# Patient Record
Sex: Male | Born: 1950 | Race: White | Hispanic: No | Marital: Married | Smoking: Former smoker
Health system: Southern US, Community
[De-identification: ages and names within clinical notes are randomized; demographics above are authoritative.]

## PROBLEM LIST (undated history)

## (undated) DIAGNOSIS — E559 Vitamin D deficiency, unspecified: Secondary | ICD-10-CM

## (undated) DIAGNOSIS — E291 Testicular hypofunction: Secondary | ICD-10-CM

## (undated) DIAGNOSIS — Z9852 Vasectomy status: Secondary | ICD-10-CM

## (undated) DIAGNOSIS — I10 Essential (primary) hypertension: Secondary | ICD-10-CM

## (undated) DIAGNOSIS — Z9889 Other specified postprocedural states: Secondary | ICD-10-CM

## (undated) DIAGNOSIS — E119 Type 2 diabetes mellitus without complications: Secondary | ICD-10-CM

## (undated) DIAGNOSIS — E785 Hyperlipidemia, unspecified: Secondary | ICD-10-CM

## (undated) HISTORY — DX: Hyperlipidemia, unspecified: E78.5

## (undated) HISTORY — DX: Vitamin D deficiency, unspecified: E55.9

## (undated) HISTORY — DX: Testicular hypofunction: E29.1

## (undated) HISTORY — DX: Vasectomy status: Z98.52

## (undated) HISTORY — DX: Type 2 diabetes mellitus without complications: E11.9

## (undated) HISTORY — DX: Other specified postprocedural states: Z98.890

## (undated) HISTORY — DX: Essential (primary) hypertension: I10

---

## 1985-04-14 DIAGNOSIS — Z9852 Vasectomy status: Secondary | ICD-10-CM

## 1985-04-14 HISTORY — PX: VASECTOMY: SHX75

## 1985-04-14 HISTORY — DX: Vasectomy status: Z98.52

## 1995-04-15 HISTORY — PX: INGUINAL HERNIA REPAIR: SUR1180

## 1996-04-14 HISTORY — PX: INGUINAL HERNIA REPAIR: SUR1180

## 1998-01-11 ENCOUNTER — Ambulatory Visit (HOSPITAL_COMMUNITY): Admission: RE | Admit: 1998-01-11 | Discharge: 1998-01-11 | Payer: Self-pay | Admitting: Internal Medicine

## 2000-01-10 ENCOUNTER — Ambulatory Visit (HOSPITAL_COMMUNITY): Admission: RE | Admit: 2000-01-10 | Discharge: 2000-01-10 | Payer: Self-pay | Admitting: Internal Medicine

## 2000-01-10 ENCOUNTER — Encounter: Payer: Self-pay | Admitting: Internal Medicine

## 2002-08-24 ENCOUNTER — Encounter: Payer: Self-pay | Admitting: Surgery

## 2002-08-24 ENCOUNTER — Ambulatory Visit (HOSPITAL_COMMUNITY): Admission: RE | Admit: 2002-08-24 | Discharge: 2002-08-24 | Payer: Self-pay | Admitting: Surgery

## 2003-04-15 DIAGNOSIS — E785 Hyperlipidemia, unspecified: Secondary | ICD-10-CM

## 2003-04-15 HISTORY — DX: Hyperlipidemia, unspecified: E78.5

## 2004-04-14 HISTORY — PX: COLONOSCOPY: SHX174

## 2004-07-16 ENCOUNTER — Ambulatory Visit: Payer: Self-pay | Admitting: Gastroenterology

## 2004-09-16 ENCOUNTER — Ambulatory Visit: Payer: Self-pay | Admitting: Gastroenterology

## 2004-09-23 ENCOUNTER — Ambulatory Visit: Payer: Self-pay | Admitting: Gastroenterology

## 2006-04-14 DIAGNOSIS — E559 Vitamin D deficiency, unspecified: Secondary | ICD-10-CM

## 2006-04-14 HISTORY — DX: Vitamin D deficiency, unspecified: E55.9

## 2006-09-23 ENCOUNTER — Emergency Department (HOSPITAL_COMMUNITY): Admission: EM | Admit: 2006-09-23 | Discharge: 2006-09-23 | Payer: Self-pay | Admitting: Emergency Medicine

## 2007-04-15 DIAGNOSIS — I10 Essential (primary) hypertension: Secondary | ICD-10-CM

## 2007-04-15 HISTORY — DX: Essential (primary) hypertension: I10

## 2007-04-15 HISTORY — PX: INGUINAL HERNIA REPAIR: SUR1180

## 2008-04-14 HISTORY — PX: KNEE ARTHROSCOPY: SHX127

## 2008-08-21 ENCOUNTER — Ambulatory Visit (HOSPITAL_BASED_OUTPATIENT_CLINIC_OR_DEPARTMENT_OTHER): Admission: RE | Admit: 2008-08-21 | Discharge: 2008-08-21 | Payer: Self-pay | Admitting: Specialist

## 2008-12-27 IMAGING — CT CT ABDOMEN W/O CM
2 of 4 series · 14 of 32 positions shown, 19 images · IV contrast (agent unspecified)
Comparison: None available.

CLINICAL DATA: Left flank pain.  Question ureteral calculus. 
ABDOMEN CT WITHOUT CONTRAST:
TECHNIQUE: Multidetector CT imaging of the abdomen was performed following the standard protocol without IV contrast.
TECHNIQUE: Multidetector CT imaging of the pelvis was performed following the standard protocol without IV contrast.

[Series 2: (person_name) · axial · 0.82mm/px · z∈[-424,-74]mm · 6 of 98 slices shown, 11 images]
[im 14/98  soft-tissue]
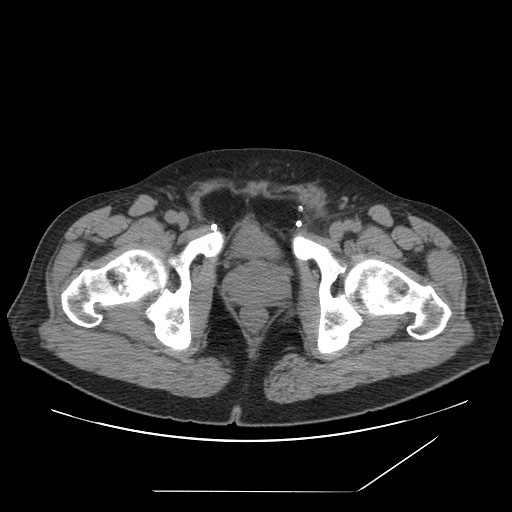
[im 14/98  bone]
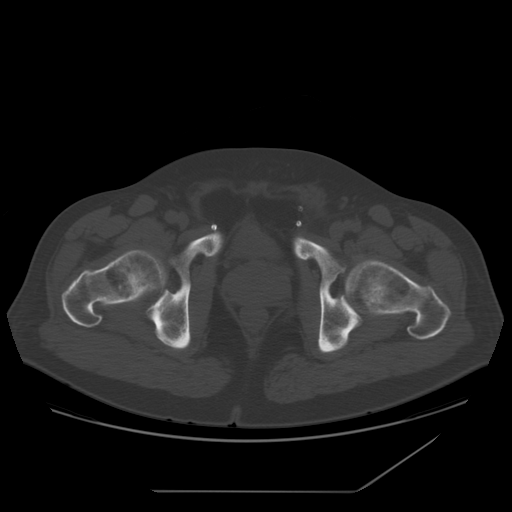
[im 28/98  soft-tissue]
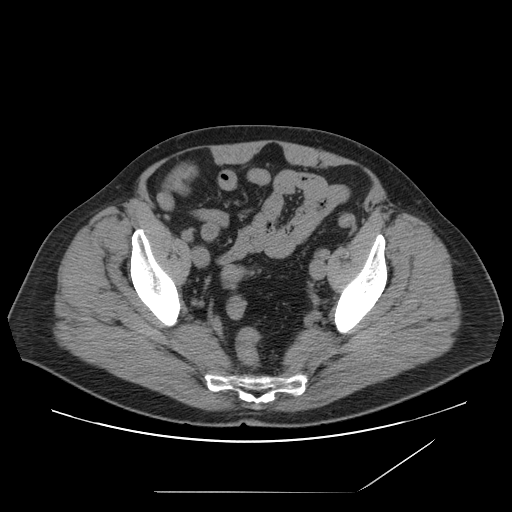
[im 42/98  soft-tissue]
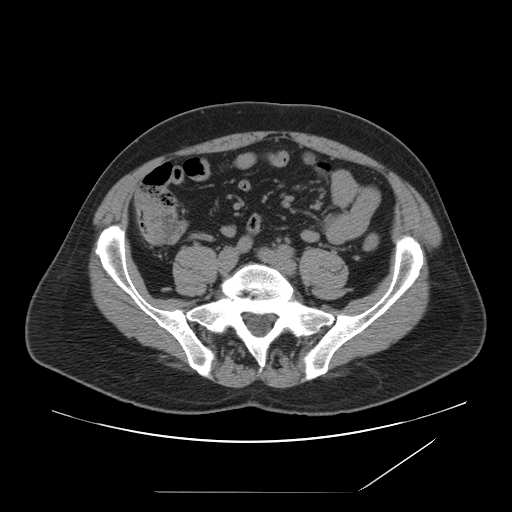
[im 42/98  lung]
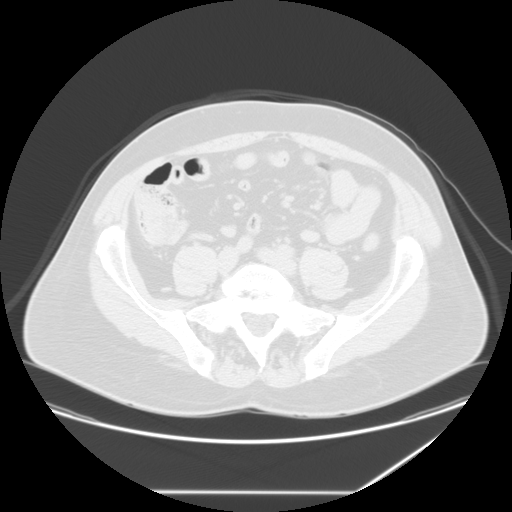
[im 56/98  soft-tissue]
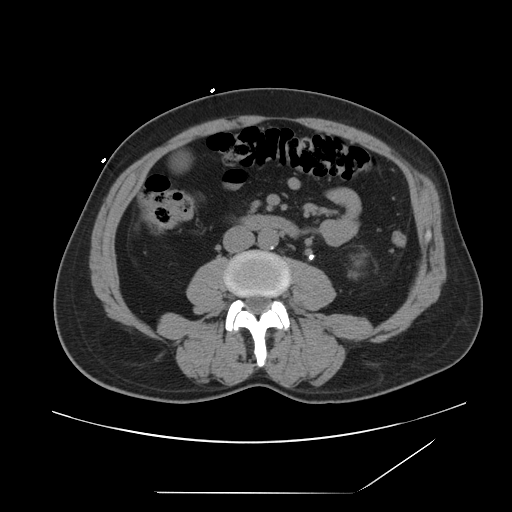
[im 56/98  lung]
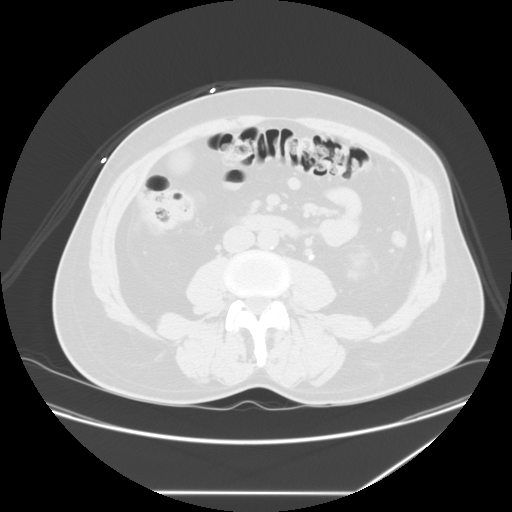
[im 70/98  soft-tissue]
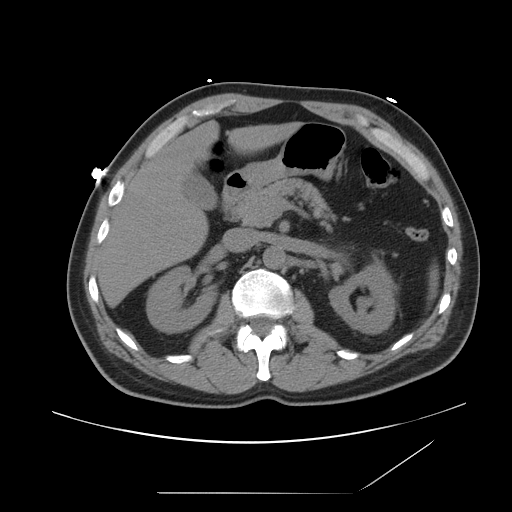
[im 70/98  lung]
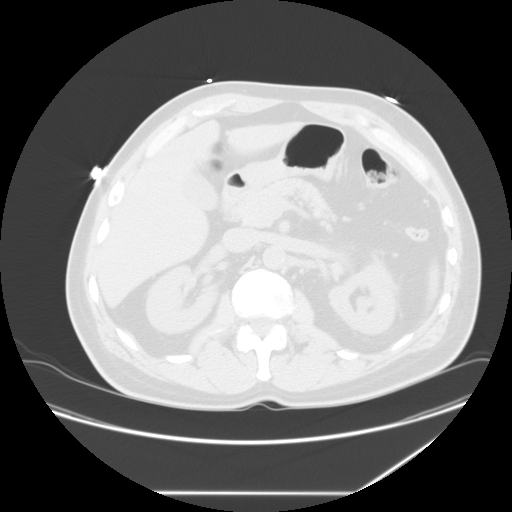
[im 84/98  soft-tissue]
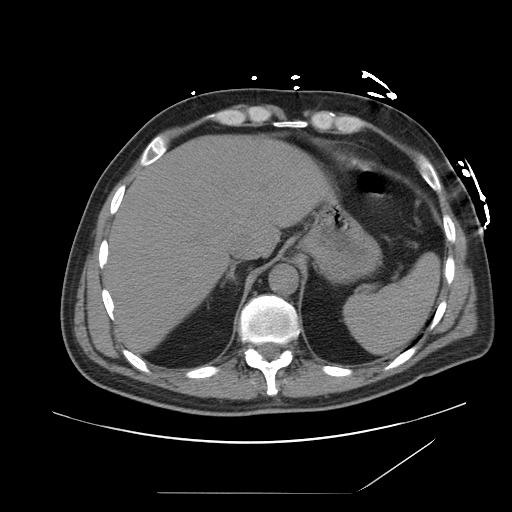
[im 84/98  lung]
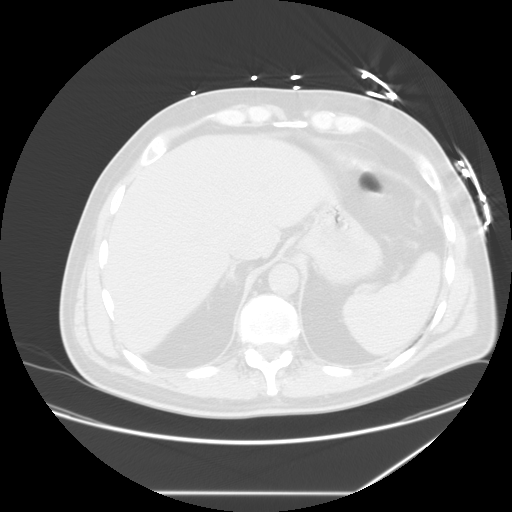

[Series 103: reformatted · sagittal · 0.82mm/px · 8 of 158 slices shown]
[im 15/158  soft-tissue]
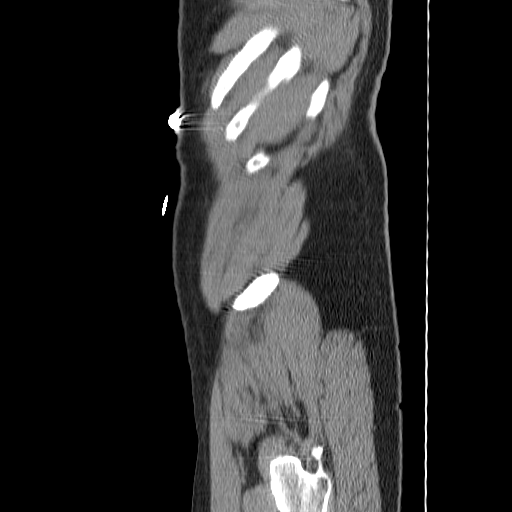
[im 29/158  soft-tissue]
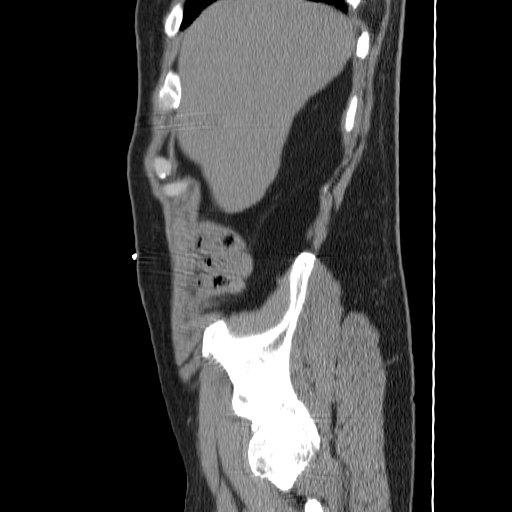
[im 58/158  soft-tissue]
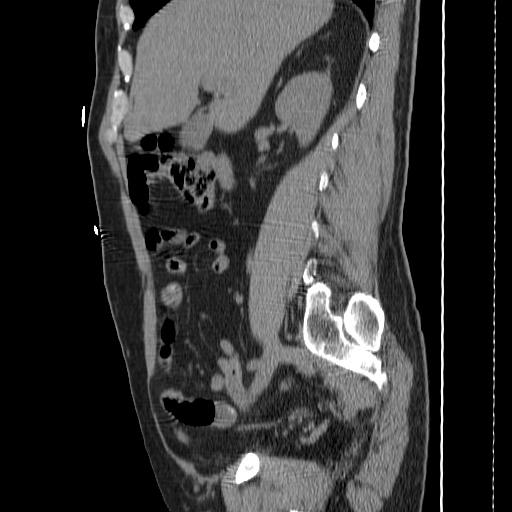
[im 72/158  soft-tissue]
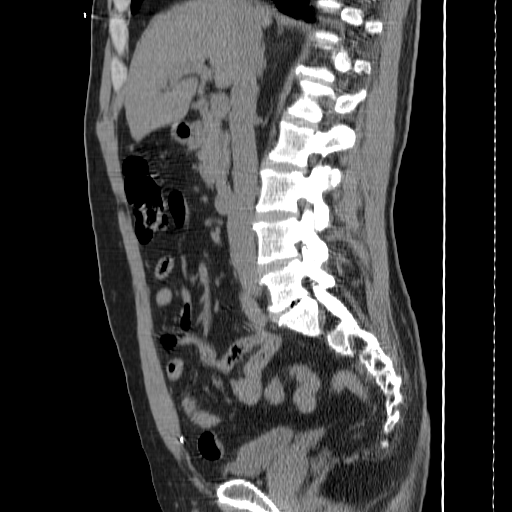
[im 86/158  soft-tissue]
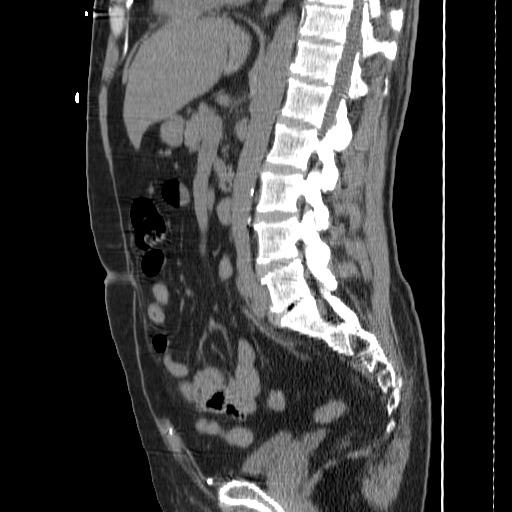
[im 100/158  soft-tissue]
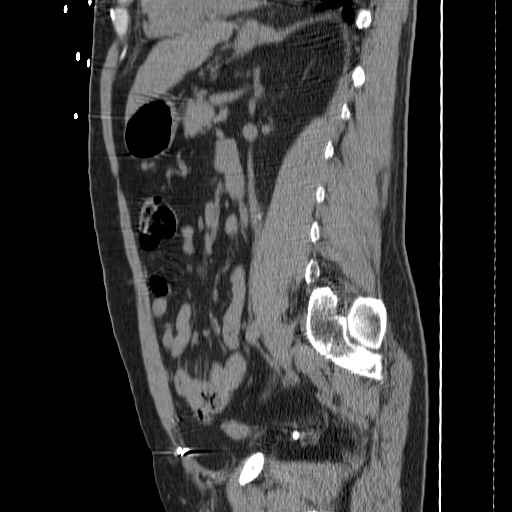
[im 129/158  soft-tissue]
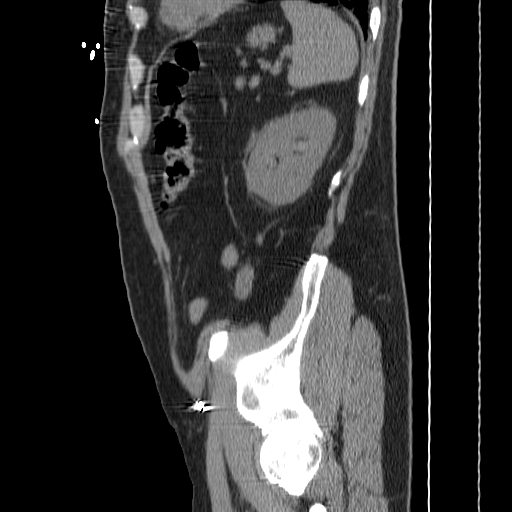
[im 143/158  soft-tissue]
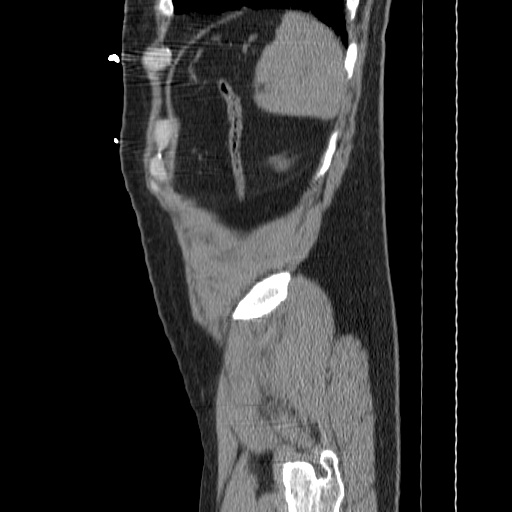

[14 of 32 positions shown; findings below may reference images not displayed]

FINDINGS: There is bibasilar atelectasis.  The left kidney demonstrates mild hydronephrosis and moderate perinephric soft tissue stranding.  The ureter is dilated to the L3-4 disc space level where there is a 5 mm calculus seen on image 43.  This is not clearly seen on the scout view due to overlying gas in the transverse colon.  No renal calculi are demonstrated.  The right kidney appears unremarkable as imaged in the unenhanced state.  The liver, spleen, gallbladder, pancreas and adrenal glands also appear unremarkable as imaged in the unenhanced state.
IMPRESSION: Obstructing 5 mm calculus in the proximal left ureter as described. 
PELVIS CT WITHOUT CONTRAST:
FINDINGS: Distally, the ureters are normal in caliber.  There are no bladder calculi.  There are postsurgical changes of the suprapubic anterior abdominal wall without evidence of recurrent hernia.  The prostate gland is mildly enlarged.
IMPRESSION: No acute pelvic findings.

## 2009-10-14 ENCOUNTER — Emergency Department (HOSPITAL_COMMUNITY)
Admission: EM | Admit: 2009-10-14 | Discharge: 2009-10-14 | Payer: Self-pay | Source: Home / Self Care | Admitting: Emergency Medicine

## 2010-06-30 LAB — URINALYSIS, ROUTINE W REFLEX MICROSCOPIC
Bilirubin Urine: NEGATIVE
Glucose, UA: NEGATIVE mg/dL
Hgb urine dipstick: NEGATIVE
Leukocytes, UA: NEGATIVE
Nitrite: NEGATIVE
Protein, ur: 30 mg/dL — AB
Specific Gravity, Urine: 1.043 — ABNORMAL HIGH (ref 1.005–1.030)
Urobilinogen, UA: 1 mg/dL (ref 0.0–1.0)
pH: 6 (ref 5.0–8.0)

## 2010-06-30 LAB — URINE MICROSCOPIC-ADD ON

## 2010-07-14 DIAGNOSIS — E119 Type 2 diabetes mellitus without complications: Secondary | ICD-10-CM

## 2010-07-14 HISTORY — DX: Type 2 diabetes mellitus without complications: E11.9

## 2010-07-23 LAB — BASIC METABOLIC PANEL
BUN: 13 mg/dL (ref 6–23)
Calcium: 9.5 mg/dL (ref 8.4–10.5)
Creatinine, Ser: 0.8 mg/dL (ref 0.4–1.5)
GFR calc non Af Amer: >60 mL/min (ref >60)
Glucose, Bld: 112 mg/dL — ABNORMAL HIGH (ref 70–99)

## 2010-07-23 LAB — CBC
HCT: 44.2 % (ref 39.0–52.0)
Platelets: 264 10*3/uL (ref 150–400)
RDW: 12.4 % (ref 11.5–15.5)
WBC: 6.6 10*3/uL (ref 4.0–10.5)

## 2010-08-27 NOTE — Op Note (Signed)
NAME:  Parker Humphrey, GLOSS NO.:  [id number]   MEDICAL RECORD NO.:  [id number]          PATIENT TYPE:  AMB   LOCATION:  [facility]                         FACILITY:  [facility]   PHYSICIAN:  Jene Every, M.D.    DATE OF BIRTH:  [birth year]   DATE OF PROCEDURE:  08/21/2008  DATE OF DISCHARGE:                               OPERATIVE REPORT   PREOPERATIVE DIAGNOSIS:  Medial meniscus tear, left knee.   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   PROCEDURES PERFORMED:  1. Left knee arthroscopy.  2. Chondroplasty of the medial femoral condyle, medial tibial plateau,      lateral tibial plateau, patella and femoral sulcus.  3. Partial medial meniscectomy.  4. Lavage.   BRIEF HISTORY:  A 60 year old with end-stage osteoarthrosis of the knee,  degenerative changes of the medial compartment, meniscus tear on MRI,  locking and giving way.  Refractory to conservative treatment.  He was  indicated for arthroscopic debridement, partial medial meniscectomy.  The risks and benefits were discussed including bleeding, infection,  damage to vessels, DVT, PE, anesthetic complications, need for total  knee arthroplasty, etc.   TECHNIQUE:  With the patient in supine position after adequate general  endotracheal anesthesia, 1 gram of Kefzol, the left lower extremity was  prepped and draped in the usual sterile fashion.  The lateral  parapatellar portal and superomedial parapatellar portal was fashioned  with a #11 blade.  Ingress cannula atraumatically placed.  Irrigant was  utilized to insufflate the joint and direct visualization the medial  parapatellar portal was fashioned with a #11 blade after localization  with 18 gauge needle sparing the medial meniscus.  Noted there was  extensive grade III changes of femoral condyle  and grade IV changes  well.  The shaver was introduced and utilized to perform a chondroplasty  lightly of the medial femoral condyle and tibial plateau.  There was a  tear of medial meniscus which was unstable that was resected to a stable  base with a 4.2 Kuda shaver.  The loose bodies were evacuated as well.  The lateral compartment revealed some grade III changes, less so than  the medial compartment.  A chondroplasty was performed.  Here there was  radial tearing of lateral meniscus.  This was debrided to a stable base.   The ACL and PCL were unremarkable.   Since the grade III changes of the patella a chondroplasty was performed  here.  There was normal patellofemoral tracking.  The gutters were  unremarkable.   The knee was copiously lavaged and I reexamined the medial compartment.  No further pathology amenable to arthroscopic intervention.   Next, the knee was copiously lavaged.  All instrumentation was removed.  The portals were closed with 4-0 nylon simple sutures.  Marcaine 0.25%  with epinephrine was infiltrated in the joint.  The wound was dressed  sterilely.  He was awoken without difficulty and transported to the  recovery room in  satisfactory condition.   The patient tolerated the procedure and there were no complications.   No assistant.      Jene Every, M.D.  Electronically Signed     JB/MEDQ  D:  08/21/2008  T:  08/21/2008  Job:  [id number]

## 2010-08-30 NOTE — Op Note (Signed)
NAME:  Parker Humphrey, Parker Humphrey                          ACCOUNT NO.:  [id number]   MEDICAL RECORD NO.:  [id number]                   PATIENT TYPE:  AMB   LOCATION:  DAY                                  FACILITY:  [facility]   PHYSICIAN:  Thornton Park. Daphine Deutscher, M.D.             DATE OF BIRTH:  Jan 19, 1951   DATE OF PROCEDURE:  08/24/2002  DATE OF DISCHARGE:                                 OPERATIVE REPORT   PREOPERATIVE DIAGNOSIS:  Bilateral inguinal hernias with a twice recurrent  hernia on the left.   POSTOPERATIVE DIAGNOSIS:  Left recurrent direct inguinal hernia with  incarcerated properitoneal fat and right direct inguinal hernia.   SURGEON:  Thornton Park. Daphine Deutscher, M.D.   PROCEDURE:  Laparoscopic preperitoneal bilateral inguinal hernia repair with  bard 3D max mesh right large, left medium.   DESCRIPTION OF PROCEDURE:  Parker Humphrey was taken to room one and general  anesthesia was administered. Preoperatively he did receive 1 g of Ancef. The  abdomen was shaved and then prepped widely with Betadine including his  genitals to his xiphoid. A transverse incision was made beneath his  umbilicus and I slid off to the right side and incised the rectus  longitudinally. I swept the muscle laterally and then with my finger went  down along the medial aspect of the rectus abdominis sheath. I inserted the  balloon which went down to the pubis. The balloon then deployed although it  did take down the right rectus muscle somewhat through the anterior  abdominal wall. I inserted the balloon tip catheter into this space and  inflated the preperitoneal space. I did get some concomitant Pneumo and  inserted a Veress needle subsequently to decompress that. In the meantime, I  under direct vision inserted a needle and placed two 5 mm ports slightly to  either side of the midline and these were placed under direct vision with a  scope. Ultimately, a 30 degree scope was used. The patient had an  incarcerated left  inguinal hernia that had properitoneal mesh. This was  pulled out of the hole and then easily visible direct hernia defect was  left. I dissected the cord structures and it seemed that there was no  evidence of an indirect hernia that had recurred. On the right side, he had  a broad base direct defect. Both were checked using a finger down in the  groin to palpate. A large piece of mesh on the right was then inserted which  covered completely the right defect as well as it overlapped in the midline  and covered the very medial direct recurrence on the left. I then put a  piece of three medium left 3D max off to the left side which gave good  overlap and complete coverage to the left side as well. Both were tacked  along Cooper's ligament and anteriorly and laterally only to where I could  feel  them. Essentially no bleeding was noted. The  preperitoneal space was deflated under direct vision. The umbilical fascial  defect was closed with 2-0 Vicryl. 4-0 Vicryl was used in the subcutaneous  tissue and Benzoin and Steri-Strips used on the skin. The patient seemed to  tolerate the procedure well and was taken to the recovery room in  satisfactory condition.                                               Thornton Park Daphine Deutscher, M.D.    MBM/MEDQ  D:  08/24/2002  T:  08/25/2002  Job:  [id number]   cc:   Lucky Cowboy, M.D.  [address]  [place], [place] [address]  Fax: [phone]

## 2011-01-30 LAB — I-STAT 8, (EC8 V) (CONVERTED LAB)
Chloride: 106
HCT: 44
Hemoglobin: 15
Operator id: 189501
Potassium: 3.7
Sodium: 140
TCO2: 27
pH, Ven: 7.366 — ABNORMAL HIGH

## 2011-01-30 LAB — URINALYSIS, ROUTINE W REFLEX MICROSCOPIC
Bilirubin Urine: NEGATIVE
Hgb urine dipstick: NEGATIVE
Ketones, ur: >80 — AB
Specific Gravity, Urine: 1.023
Urobilinogen, UA: 1

## 2011-01-30 LAB — POCT I-STAT CREATININE: Operator id: 189501

## 2013-01-12 DIAGNOSIS — E291 Testicular hypofunction: Secondary | ICD-10-CM

## 2013-01-12 HISTORY — DX: Testicular hypofunction: E29.1

## 2013-03-13 ENCOUNTER — Other Ambulatory Visit: Payer: Self-pay | Admitting: Internal Medicine

## 2013-03-31 ENCOUNTER — Other Ambulatory Visit: Payer: Self-pay | Admitting: Internal Medicine

## 2013-04-26 ENCOUNTER — Ambulatory Visit: Payer: Self-pay | Admitting: Internal Medicine

## 2013-09-14 ENCOUNTER — Ambulatory Visit (INDEPENDENT_AMBULATORY_CARE_PROVIDER_SITE_OTHER): Payer: BC Managed Care – PPO | Admitting: Internal Medicine

## 2013-09-14 ENCOUNTER — Encounter: Payer: Self-pay | Admitting: Internal Medicine

## 2013-09-14 VITALS — BP 128/80 | HR 64 | Temp 97.9°F | Resp 16 | Ht 72.5 in | Wt 192.0 lb

## 2013-09-14 DIAGNOSIS — Z125 Encounter for screening for malignant neoplasm of prostate: Secondary | ICD-10-CM | POA: Diagnosis not present

## 2013-09-14 DIAGNOSIS — R7402 Elevation of levels of lactic acid dehydrogenase (LDH): Secondary | ICD-10-CM | POA: Diagnosis not present

## 2013-09-14 DIAGNOSIS — Z113 Encounter for screening for infections with a predominantly sexual mode of transmission: Secondary | ICD-10-CM

## 2013-09-14 DIAGNOSIS — Z0000 Encounter for general adult medical examination without abnormal findings: Secondary | ICD-10-CM

## 2013-09-14 DIAGNOSIS — Z79899 Other long term (current) drug therapy: Secondary | ICD-10-CM | POA: Insufficient documentation

## 2013-09-14 DIAGNOSIS — R7401 Elevation of levels of liver transaminase levels: Secondary | ICD-10-CM | POA: Diagnosis not present

## 2013-09-14 DIAGNOSIS — I10 Essential (primary) hypertension: Secondary | ICD-10-CM | POA: Insufficient documentation

## 2013-09-14 DIAGNOSIS — R740 Nonspecific elevation of levels of transaminase and lactic acid dehydrogenase [LDH]: Secondary | ICD-10-CM

## 2013-09-14 DIAGNOSIS — E559 Vitamin D deficiency, unspecified: Secondary | ICD-10-CM

## 2013-09-14 DIAGNOSIS — R7303 Prediabetes: Secondary | ICD-10-CM | POA: Insufficient documentation

## 2013-09-14 DIAGNOSIS — Z111 Encounter for screening for respiratory tuberculosis: Secondary | ICD-10-CM | POA: Diagnosis not present

## 2013-09-14 DIAGNOSIS — E782 Mixed hyperlipidemia: Secondary | ICD-10-CM | POA: Insufficient documentation

## 2013-09-14 DIAGNOSIS — Z1212 Encounter for screening for malignant neoplasm of rectum: Secondary | ICD-10-CM

## 2013-09-14 LAB — HEMOGLOBIN A1C
HEMOGLOBIN A1C: 5.9 % — AB (ref <5.7)
MEAN PLASMA GLUCOSE: 123 mg/dL — AB (ref <117)

## 2013-09-14 LAB — CBC WITH DIFFERENTIAL/PLATELET
Basophils Absolute: 0.1 10*3/uL (ref 0.0–0.1)
Basophils Relative: 1 % (ref 0–1)
Eosinophils Absolute: 0.1 10*3/uL (ref 0.0–0.7)
Eosinophils Relative: 2 % (ref 0–5)
HEMATOCRIT: 43.5 % (ref 39.0–52.0)
HEMOGLOBIN: 15 g/dL (ref 13.0–17.0)
LYMPHS ABS: 1.5 10*3/uL (ref 0.7–4.0)
LYMPHS PCT: 24 % (ref 12–46)
MCH: 29.4 pg (ref 26.0–34.0)
MCHC: 34.5 g/dL (ref 30.0–36.0)
MCV: 85.1 fL (ref 78.0–100.0)
MONO ABS: 0.4 10*3/uL (ref 0.1–1.0)
MONOS PCT: 7 % (ref 3–12)
NEUTROS ABS: 4 10*3/uL (ref 1.7–7.7)
NEUTROS PCT: 66 % (ref 43–77)
Platelets: 265 10*3/uL (ref 150–400)
RBC: 5.11 MIL/uL (ref 4.22–5.81)
RDW: 13.5 % (ref 11.5–15.5)
WBC: 6.1 10*3/uL (ref 4.0–10.5)

## 2013-09-14 NOTE — Progress Notes (Signed)
Patient ID: Parker Humphrey, male   DOB: 1951/02/24, 63 y.o.   MRN: [id number]   Annual Screening Comprehensive Examination  This very nice 63 y.o.MWM presents for complete physical.  Patient has been followed for labile HTN,  Prediabetes, Hyperlipidemia, and Vitamin D Deficiency.   Labile HTN predates since 2007 and has been monitored expectantly. Patient's BP has been controlled at home.Today's BP: 128/80 mmHg. Patient denies any cardiac symptoms as chest pain, palpitations, shortness of breath, dizziness or ankle swelling.   Patient's hyperlipidemia is Statin intolerant and  controlled with diet and Zetia. Patient denies myalgias or other medication SE's. Last cholesterol last visit was 173, triglycerides 153, HDL 40 and LDL 102 in Oct 2014.     Patient has prediabetes with A1c 6.1% in 07/2010 and last A1c was 5.8% in Oct 2014. Patient denies reactive hypoglycemic symptoms, visual blurring, diabetic polys or paresthesias.    Finally, patient has history of Vitamin D Deficiency of  37 in 2008  and last vitamin D 98 in Oct 2014.  Medication Sig  . Misc Natural Products (COSAMIN ASU ADVANCED FORMULA) CAPS TAKE 4 CAPSULES TWICE A DAY  . ZETIA 10 MG tablet TAKE 1 TABLET BY MOUTH EVERY DAY   Allergies  Allergen Reactions  . Pravastatin    Past Medical History  Diagnosis Date  . Hypertension 2009    labile-monitoring expectantly  . Hyperlipidemia 2005  . Diabetes mellitus without complication 09/628    Z6W 6.1% preDiabetes  . H/O vasectomy 7  . Vitamin D deficiency 2008    Vit D = 37  . Hypogonadism male Oct 2014    Testosterone = 255 (Patient declined treatment)    Past Surgical History  Procedure Laterality Date  . Knee arthroscopy Left 2010  . Vasectomy Bilateral 1987  . Inguinal hernia repair Bilateral 2009  . Inguinal hernia repair Left 1997  . Inguinal hernia repair Left 1998  . Colonoscopy N/A 2006   Family History  Problem Relation Age of Onset  . Cancer Father   .  Heart disease Father   . Diabetes Brother   . Hyperlipidemia Brother    History   Social History  . Marital Status: Married    Spouse Name: N/A    Number of Children: N/A  . Years of Education: N/A   Occupational History  . Retired Economist   Social History Main Topics  . Smoking status: Former Smoker -- 10 years    Quit date: 09/15/1991  . Smokeless tobacco: Never Used  . Alcohol Use: No  . Drug Use: No  . Sexual Activity: Yes     ROS Constitutional: Denies fever, chills, weight loss/gain, headaches, insomnia, fatigue, night sweats or change in appetite. Eyes: Denies redness, blurred vision, diplopia, discharge, itchy or watery eyes.  ENT: Denies discharge, congestion, post nasal drip, epistaxis, sore throat, earache, hearing loss, dental pain, Tinnitus, Vertigo, Sinus pain or snoring.  Cardio: Denies chest pain, palpitations, irregular heartbeat, syncope, dyspnea, diaphoresis, orthopnea, PND, claudication or edema Respiratory: denies cough, dyspnea, DOE, pleurisy, hoarseness, laryngitis or wheezing.  Gastrointestinal: Denies dysphagia, heartburn, reflux, water brash, pain, cramps, nausea, vomiting, bloating, diarrhea, constipation, hematemesis, melena, hematochezia, jaundice or hemorrhoids Genitourinary: Denies dysuria, frequency, urgency, nocturia, hesitancy, discharge, hematuria or flank pain Musculoskeletal: Denies arthralgia, myalgia, stiffness, Jt. Swelling, pain, limp or strain/sprain. Skin: Denies puritis, rash, hives, warts, acne, eczema or change in skin lesion Neuro: No weakness, tremor, incoordination, spasms, paresthesia or pain Psychiatric: Denies confusion, memory loss or sensory loss  Endocrine: Denies change in weight, skin, hair change, nocturia, and paresthesia, diabetic polys, visual blurring or hyper / hypo glycemic episodes.  Heme/Lymph: No excessive bleeding, bruising or enlarged lymph nodes.  Physical Exam  BP 128/80  P 64  T 97.9 F   Resp  16  Ht 6' 0.5"   Wt 192 lb   BMI 25.67 kg/m2  General Appearance: Well nourished, in no apparent distress. Eyes: PERRLA, EOMs, conjunctiva no swelling or erythema, normal fundi and vessels. Sinuses: No frontal/maxillary tenderness ENT/Mouth: EACs patent / TMs  nl. Nares clear without erythema, swelling, mucoid exudates. Oral hygiene is good. No erythema, swelling, or exudate. Tongue normal, non-obstructing. Tonsils not swollen or erythematous. Hearing normal.  Neck: Supple, thyroid normal. No bruits, nodes or JVD. Respiratory: Respiratory effort normal.  BS equal and clear bilateral without rales, rhonci, wheezing or stridor. Cardio: Heart sounds are normal with regular rate and rhythm and no murmurs, rubs or gallops. Peripheral pulses are normal and equal bilaterally without edema. No aortic or femoral bruits. Chest: symmetric with normal excursions and percussion.  Abdomen: Flat, soft, with bowl sounds. Nontender, no guarding, rebound, hernias, masses, or organomegaly.  Lymphatics: Non tender without lymphadenopathy.  Genitourinary: No hernias.Testes nl. DRE - prostate nl for age - smooth & firm w/o nodules. Musculoskeletal: Full ROM all peripheral extremities, joint stability, 5/5 strength, and normal gait. Skin: Warm and dry without rashes, lesions, cyanosis, clubbing or  ecchymosis.  Neuro: Cranial nerves intact, reflexes equal bilaterally. Normal muscle tone, no cerebellar symptoms. Sensation intact.  Pysch: Awake and oriented X 3, normal affect, insight and judgment appropriate.   Assessment and Plan  1. Annual Screening Examination 2. Hypertension, labile  3. Hyperlipidemia 4. Pre Diabetes 5. Vitamin D Deficiency  Continue prudent diet as discussed, weight control, BP monitoring, regular exercise, and medications as discussed.  Discussed med effects and SE's. Routine screening labs and tests as requested with regular follow-up as recommended.

## 2013-09-14 NOTE — Patient Instructions (Signed)

## 2013-09-15 LAB — HEPATIC FUNCTION PANEL
ALK PHOS: 42 U/L (ref 39–117)
ALT: 18 U/L (ref 0–53)
AST: 18 U/L (ref 0–37)
Albumin: 4.2 g/dL (ref 3.5–5.2)
BILIRUBIN DIRECT: 0.2 mg/dL (ref 0.0–0.3)
BILIRUBIN INDIRECT: 0.6 mg/dL (ref 0.2–1.2)
TOTAL PROTEIN: 6.5 g/dL (ref 6.0–8.3)
Total Bilirubin: 0.8 mg/dL (ref 0.2–1.2)

## 2013-09-15 LAB — URINALYSIS, MICROSCOPIC ONLY
Bacteria, UA: NONE SEEN
Casts: NONE SEEN
Crystals: NONE SEEN
SQUAMOUS EPITHELIAL / LPF: NONE SEEN

## 2013-09-15 LAB — TSH: TSH: 1.098 u[IU]/mL (ref 0.350–4.500)

## 2013-09-15 LAB — BASIC METABOLIC PANEL WITH GFR
BUN: 16 mg/dL (ref 6–23)
CHLORIDE: 103 meq/L (ref 96–112)
CO2: 25 mEq/L (ref 19–32)
Calcium: 9.5 mg/dL (ref 8.4–10.5)
Creat: 0.77 mg/dL (ref 0.50–1.35)
GFR, Est Non African American: >89 mL/min
GLUCOSE: 76 mg/dL (ref 70–99)
POTASSIUM: 4.5 meq/L (ref 3.5–5.3)
SODIUM: 139 meq/L (ref 135–145)

## 2013-09-15 LAB — HEPATITIS C ANTIBODY: HCV Ab: NEGATIVE

## 2013-09-15 LAB — PSA: PSA: 1.89 ng/mL (ref <=4.00)

## 2013-09-15 LAB — RPR

## 2013-09-15 LAB — HIV ANTIBODY (ROUTINE TESTING W REFLEX): HIV: NONREACTIVE

## 2013-09-15 LAB — MICROALBUMIN / CREATININE URINE RATIO
CREATININE, URINE: 340.1 mg/dL
Microalb Creat Ratio: 3.1 mg/g (ref 0.0–30.0)
Microalb, Ur: 1.06 mg/dL (ref 0.00–1.89)

## 2013-09-15 LAB — VITAMIN B12: VITAMIN B 12: 347 pg/mL (ref 211–911)

## 2013-09-15 LAB — LIPID PANEL
CHOL/HDL RATIO: 4.3 ratio
CHOLESTEROL: 177 mg/dL (ref 0–200)
HDL: 41 mg/dL (ref >39)
LDL CALC: 114 mg/dL — AB (ref 0–99)
TRIGLYCERIDES: 109 mg/dL (ref <150)
VLDL: 22 mg/dL (ref 0–40)

## 2013-09-15 LAB — MAGNESIUM: Magnesium: 1.9 mg/dL (ref 1.5–2.5)

## 2013-09-15 LAB — HEPATITIS B SURFACE ANTIBODY,QUALITATIVE: Hep B S Ab: NEGATIVE

## 2013-09-15 LAB — HEPATITIS B CORE ANTIBODY, TOTAL: Hep B Core Total Ab: NONREACTIVE

## 2013-09-15 LAB — HEPATITIS A ANTIBODY, TOTAL: Hep A Total Ab: NONREACTIVE

## 2013-09-15 LAB — TESTOSTERONE: TESTOSTERONE: 317 ng/dL (ref 300–890)

## 2013-09-15 LAB — VITAMIN D 25 HYDROXY (VIT D DEFICIENCY, FRACTURES): Vit D, 25-Hydroxy: 110 ng/mL — ABNORMAL HIGH (ref 30–89)

## 2013-09-15 LAB — INSULIN, FASTING: Insulin fasting, serum: 8 u[IU]/mL (ref 3–28)

## 2013-09-16 LAB — TB SKIN TEST
INDURATION: 0 mm
TB SKIN TEST: NEGATIVE

## 2013-09-16 LAB — HEPATITIS B E ANTIBODY: HEPATITIS BE ANTIBODY: NONREACTIVE

## 2014-01-03 ENCOUNTER — Ambulatory Visit: Payer: Self-pay | Admitting: Physician Assistant

## 2014-03-18 ENCOUNTER — Other Ambulatory Visit: Payer: Self-pay | Admitting: Physician Assistant

## 2014-03-20 ENCOUNTER — Other Ambulatory Visit: Payer: Self-pay | Admitting: *Deleted

## 2014-03-20 MED ORDER — EZETIMIBE 10 MG PO TABS: 10.0000 mg | ORAL_TABLET | Freq: Every day | ORAL | Status: DC

## 2014-04-15 ENCOUNTER — Encounter: Payer: Self-pay | Admitting: Internal Medicine

## 2014-04-15 NOTE — Progress Notes (Signed)
Patient ID: Parker Humphrey, male   DOB: November 20, 1950, 64 y.o.   MRN: [id number]   This very nice 64 y.o.MWM presents for 3 month follow up with Hypertension, Hyperlipidemia, Pre-Diabetes and Vitamin D Deficiency.    Patient is treated for HTN & BP has been controlled at home. Today's BP: (!) 142/78 mmHg. Patient has had no complaints of any cardiac type chest pain, palpitations, dyspnea/orthopnea/PND, dizziness, claudication, or dependent edema.   Hyperlipidemia is controlled with diet & meds. Patient denies myalgias or other med SE's. Last Lipids were at goal -  Total  Cholesterol, 177; HDL 41; LDL 114*; Trig 109 on 09/14/2013.   Also, the patient has history of PreDiabetes and has had no symptoms of reactive hypoglycemia, diabetic polys, paresthesias or visual blurring.  Last A1c was  5.9% on  09/14/2013.   Further, the patient also has history of Vitamin D Deficiency and supplements vitamin D without any suspected side-effects. Last vitamin D was  110 on  09/14/2013 and dose was decreased.    Medication List   ezetimibe 10 MG tablet  Commonly known as:  ZETIA  Take 1 tablet (10 mg total) by mouth daily.     Allergies  Allergen Reactions  . Pravastatin   . Red Yeast Rice [Cholestin] Rash   PMHx:   Past Medical History  Diagnosis Date  . Hypertension 2009    labile-monitoring expectantly  . Hyperlipidemia 2005  . Diabetes mellitus without complication 12/4763    Y6T 6.1% preDiabetes  . H/O vasectomy 42  . Vitamin D deficiency 2008    Vit D = 37  . Hypogonadism male Oct 2014    Testosterone = 255 (Patient declined treatment)   Immunization History  Administered Date(s) Administered  . DTaP 04/14/2009  . Influenza Split 01/12/2013  . PPD Test 09/14/2013  . Pneumococcal Polysaccharide-23 04/14/1997  . Td 04/14/1997  . Tdap 07/31/2009   Past Surgical History  Procedure Laterality Date  . Knee arthroscopy Left 2010  . Vasectomy Bilateral 1987  . Inguinal hernia repair Bilateral  2009  . Inguinal hernia repair Left 1997  . Inguinal hernia repair Left 1998  . Colonoscopy N/A 2006   FHx:    Reviewed / unchanged  SHx:    Reviewed / unchanged  Systems Review:  Constitutional: Denies fever, chills, wt changes, headaches, insomnia, fatigue, night sweats, change in appetite. Eyes: Denies redness, blurred vision, diplopia, discharge, itchy, watery eyes.  ENT: Denies discharge, congestion, post nasal drip, epistaxis, sore throat, earache, hearing loss, dental pain, tinnitus, vertigo, sinus pain, snoring.  CV: Denies chest pain, palpitations, irregular heartbeat, syncope, dyspnea, diaphoresis, orthopnea, PND, claudication or edema. Respiratory: denies cough, dyspnea, DOE, pleurisy, hoarseness, laryngitis, wheezing.  Gastrointestinal: Denies dysphagia, odynophagia, heartburn, reflux, water brash, abdominal pain or cramps, nausea, vomiting, bloating, diarrhea, constipation, hematemesis, melena, hematochezia  or hemorrhoids. Genitourinary: Denies dysuria, frequency, urgency, nocturia, hesitancy, discharge, hematuria or flank pain. Musculoskeletal: Denies arthralgias, myalgias, stiffness, jt. swelling, pain, limping or strain/sprain.  Skin: Denies pruritus, rash, hives, warts, acne, eczema or change in skin lesion(s). Neuro: No weakness, tremor, incoordination, spasms, paresthesia or pain. Psychiatric: Denies confusion, memory loss or sensory loss. Endo: Denies change in weight, skin or hair change.  Heme/Lymph: No excessive bleeding, bruising or enlarged lymph nodes.  Physical Exam  BP 142/78   Pulse 64  Temp 98.1 F   Resp 16  Ht 6' 0.5"   Wt 193 lb 12.8 oz    BMI 25.91   Appears well nourished and  in no distress. Eyes: PERRLA, EOMs, conjunctiva no swelling or erythema. Sinuses: No frontal/maxillary tenderness ENT/Mouth: EAC's clear, TM's nl w/o erythema, bulging. Nares clear w/o erythema, swelling, exudates. Oropharynx clear without erythema or exudates. Oral  hygiene is good. Tongue normal, non obstructing. Hearing intact.  Neck: Supple. Thyroid nl. Car 2+/2+ without bruits, nodes or JVD. Chest: Respirations nl with BS clear & equal w/o rales, rhonchi, wheezing or stridor.  Cor: Heart sounds normal w/ regular rate and rhythm without sig. murmurs, gallops, clicks, or rubs. Peripheral pulses normal and equal  without edema.  Abdomen: Soft & bowel sounds normal. Non-tender w/o guarding, rebound, hernias, masses, or organomegaly.  Lymphatics: Unremarkable.  Musculoskeletal: Full ROM all peripheral extremities, joint stability, 5/5 strength, and normal gait.  Skin: Warm, dry without exposed rashes, lesions or ecchymosis apparent.  Neuro: Cranial nerves intact, reflexes equal bilaterally. Sensory-motor testing grossly intact. Tendon reflexes grossly intact.  Pysch: Alert & oriented x 3.  Insight and judgement nl & appropriate. No ideations.  Assessment and Plan:  1. Hypertension - Continue monitor blood pressure at home. Continue diet/meds same.  2. Hyperlipidemia - Continue diet/meds, exercise,& lifestyle modifications. Continue monitor periodic cholesterol/liver & renal functions   3. Pre-Diabetes - Continue diet, exercise, lifestyle modifications. Monitor appropriate labs.  4. Vitamin D Deficiency - Continue supplementation.   Recommended regular exercise, BP monitoring, weight control, and discussed med and SE's. Recommended labs to assess and monitor clinical status. Further disposition pending results of labs.

## 2014-04-15 NOTE — Patient Instructions (Signed)

## 2014-04-17 ENCOUNTER — Encounter: Payer: Self-pay | Admitting: Internal Medicine

## 2014-04-17 ENCOUNTER — Ambulatory Visit (INDEPENDENT_AMBULATORY_CARE_PROVIDER_SITE_OTHER): Payer: BLUE CROSS/BLUE SHIELD | Admitting: Internal Medicine

## 2014-04-17 VITALS — BP 142/78 | HR 64 | Temp 98.1°F | Resp 16 | Ht 72.5 in | Wt 193.8 lb

## 2014-04-17 DIAGNOSIS — R7309 Other abnormal glucose: Secondary | ICD-10-CM

## 2014-04-17 DIAGNOSIS — I10 Essential (primary) hypertension: Secondary | ICD-10-CM

## 2014-04-17 DIAGNOSIS — E559 Vitamin D deficiency, unspecified: Secondary | ICD-10-CM

## 2014-04-17 DIAGNOSIS — R7303 Prediabetes: Secondary | ICD-10-CM

## 2014-04-17 DIAGNOSIS — Z79899 Other long term (current) drug therapy: Secondary | ICD-10-CM

## 2014-04-17 DIAGNOSIS — E782 Mixed hyperlipidemia: Secondary | ICD-10-CM

## 2014-04-17 LAB — CBC WITH DIFFERENTIAL/PLATELET
BASOS ABS: 0.1 10*3/uL (ref 0.0–0.1)
BASOS PCT: 1 % (ref 0–1)
EOS ABS: 0.2 10*3/uL (ref 0.0–0.7)
EOS PCT: 3 % (ref 0–5)
HCT: 45 % (ref 39.0–52.0)
Hemoglobin: 15.5 g/dL (ref 13.0–17.0)
Lymphocytes Relative: 24 % (ref 12–46)
Lymphs Abs: 1.6 10*3/uL (ref 0.7–4.0)
MCH: 29.5 pg (ref 26.0–34.0)
MCHC: 34.4 g/dL (ref 30.0–36.0)
MCV: 85.7 fL (ref 78.0–100.0)
MONO ABS: 0.4 10*3/uL (ref 0.1–1.0)
MPV: 9.6 fL (ref 8.6–12.4)
Monocytes Relative: 6 % (ref 3–12)
Neutro Abs: 4.5 10*3/uL (ref 1.7–7.7)
Neutrophils Relative %: 66 % (ref 43–77)
PLATELETS: 310 10*3/uL (ref 150–400)
RBC: 5.25 MIL/uL (ref 4.22–5.81)
RDW: 13.1 % (ref 11.5–15.5)
WBC: 6.8 10*3/uL (ref 4.0–10.5)

## 2014-04-17 LAB — BASIC METABOLIC PANEL WITH GFR
BUN: 13 mg/dL (ref 6–23)
CHLORIDE: 100 meq/L (ref 96–112)
CO2: 28 meq/L (ref 19–32)
CREATININE: 0.8 mg/dL (ref 0.50–1.35)
Calcium: 9.3 mg/dL (ref 8.4–10.5)
GLUCOSE: 111 mg/dL — AB (ref 70–99)
Potassium: 4.3 mEq/L (ref 3.5–5.3)
Sodium: 139 mEq/L (ref 135–145)

## 2014-04-17 LAB — HEPATIC FUNCTION PANEL
ALBUMIN: 4.1 g/dL (ref 3.5–5.2)
ALT: 22 U/L (ref 0–53)
AST: 20 U/L (ref 0–37)
Alkaline Phosphatase: 61 U/L (ref 39–117)
Bilirubin, Direct: 0.1 mg/dL (ref 0.0–0.3)
Indirect Bilirubin: 0.6 mg/dL (ref 0.2–1.2)
TOTAL PROTEIN: 6.8 g/dL (ref 6.0–8.3)
Total Bilirubin: 0.7 mg/dL (ref 0.2–1.2)

## 2014-04-17 LAB — LIPID PANEL
CHOLESTEROL: 197 mg/dL (ref 0–200)
HDL: 43 mg/dL (ref >39)
LDL CALC: 120 mg/dL — AB (ref 0–99)
Total CHOL/HDL Ratio: 4.6 Ratio
Triglycerides: 168 mg/dL — ABNORMAL HIGH (ref <150)
VLDL: 34 mg/dL (ref 0–40)

## 2014-04-17 LAB — TSH: TSH: 2.844 u[IU]/mL (ref 0.350–4.500)

## 2014-04-17 LAB — MAGNESIUM: Magnesium: 2 mg/dL (ref 1.5–2.5)

## 2014-04-18 LAB — HEMOGLOBIN A1C
HEMOGLOBIN A1C: 6.1 % — AB (ref <5.7)
Mean Plasma Glucose: 128 mg/dL — ABNORMAL HIGH (ref <117)

## 2014-04-18 LAB — VITAMIN D 25 HYDROXY (VIT D DEFICIENCY, FRACTURES): Vit D, 25-Hydroxy: 53 ng/mL (ref 30–100)

## 2014-04-20 LAB — INSULIN, FASTING: INSULIN FASTING, SERUM: 5.7 u[IU]/mL (ref 2.0–19.6)

## 2014-08-02 ENCOUNTER — Ambulatory Visit: Payer: Self-pay | Admitting: Physician Assistant

## 2014-09-19 ENCOUNTER — Encounter: Payer: Self-pay | Admitting: Internal Medicine

## 2014-09-19 ENCOUNTER — Ambulatory Visit (INDEPENDENT_AMBULATORY_CARE_PROVIDER_SITE_OTHER): Payer: BLUE CROSS/BLUE SHIELD | Admitting: Internal Medicine

## 2014-09-19 VITALS — BP 138/80 | HR 72 | Temp 97.7°F | Resp 16 | Ht 72.75 in | Wt 193.0 lb

## 2014-09-19 DIAGNOSIS — I10 Essential (primary) hypertension: Secondary | ICD-10-CM

## 2014-09-19 DIAGNOSIS — Z1212 Encounter for screening for malignant neoplasm of rectum: Secondary | ICD-10-CM

## 2014-09-19 DIAGNOSIS — E559 Vitamin D deficiency, unspecified: Secondary | ICD-10-CM

## 2014-09-19 DIAGNOSIS — Z125 Encounter for screening for malignant neoplasm of prostate: Secondary | ICD-10-CM

## 2014-09-19 DIAGNOSIS — E782 Mixed hyperlipidemia: Secondary | ICD-10-CM

## 2014-09-19 DIAGNOSIS — Z79899 Other long term (current) drug therapy: Secondary | ICD-10-CM

## 2014-09-19 DIAGNOSIS — R7303 Prediabetes: Secondary | ICD-10-CM

## 2014-09-19 DIAGNOSIS — R7309 Other abnormal glucose: Secondary | ICD-10-CM

## 2014-09-19 DIAGNOSIS — Z0000 Encounter for general adult medical examination without abnormal findings: Secondary | ICD-10-CM

## 2014-09-19 DIAGNOSIS — Z111 Encounter for screening for respiratory tuberculosis: Secondary | ICD-10-CM

## 2014-09-19 DIAGNOSIS — R5383 Other fatigue: Secondary | ICD-10-CM

## 2014-09-19 LAB — BASIC METABOLIC PANEL WITH GFR
BUN: 17 mg/dL (ref 6–23)
CALCIUM: 9.6 mg/dL (ref 8.4–10.5)
CHLORIDE: 102 meq/L (ref 96–112)
CO2: 25 meq/L (ref 19–32)
CREATININE: 0.81 mg/dL (ref 0.50–1.35)
GFR, Est African American: >89 mL/min
GFR, Est Non African American: >89 mL/min
Glucose, Bld: 110 mg/dL — ABNORMAL HIGH (ref 70–99)
Potassium: 4.4 mEq/L (ref 3.5–5.3)
SODIUM: 138 meq/L (ref 135–145)

## 2014-09-19 LAB — CBC WITH DIFFERENTIAL/PLATELET
BASOS ABS: 0.1 10*3/uL (ref 0.0–0.1)
Basophils Relative: 1 % (ref 0–1)
Eosinophils Absolute: 0.3 10*3/uL (ref 0.0–0.7)
Eosinophils Relative: 5 % (ref 0–5)
HCT: 45.5 % (ref 39.0–52.0)
Hemoglobin: 15.4 g/dL (ref 13.0–17.0)
LYMPHS PCT: 27 % (ref 12–46)
Lymphs Abs: 1.5 10*3/uL (ref 0.7–4.0)
MCH: 29.4 pg (ref 26.0–34.0)
MCHC: 33.8 g/dL (ref 30.0–36.0)
MCV: 86.8 fL (ref 78.0–100.0)
MONOS PCT: 8 % (ref 3–12)
MPV: 10 fL (ref 8.6–12.4)
Monocytes Absolute: 0.4 10*3/uL (ref 0.1–1.0)
Neutro Abs: 3.2 10*3/uL (ref 1.7–7.7)
Neutrophils Relative %: 59 % (ref 43–77)
Platelets: 254 10*3/uL (ref 150–400)
RBC: 5.24 MIL/uL (ref 4.22–5.81)
RDW: 13.5 % (ref 11.5–15.5)
WBC: 5.5 10*3/uL (ref 4.0–10.5)

## 2014-09-19 LAB — TSH: TSH: 1.898 u[IU]/mL (ref 0.350–4.500)

## 2014-09-19 LAB — HEPATIC FUNCTION PANEL
ALBUMIN: 3.9 g/dL (ref 3.5–5.2)
ALK PHOS: 44 U/L (ref 39–117)
ALT: 18 U/L (ref 0–53)
AST: 17 U/L (ref 0–37)
BILIRUBIN DIRECT: 0.1 mg/dL (ref 0.0–0.3)
Indirect Bilirubin: 0.8 mg/dL (ref 0.2–1.2)
TOTAL PROTEIN: 6.7 g/dL (ref 6.0–8.3)
Total Bilirubin: 0.9 mg/dL (ref 0.2–1.2)

## 2014-09-19 LAB — LIPID PANEL
CHOL/HDL RATIO: 5 ratio
CHOLESTEROL: 185 mg/dL (ref 0–200)
HDL: 37 mg/dL — AB (ref >=40)
LDL CALC: 113 mg/dL — AB (ref 0–99)
TRIGLYCERIDES: 177 mg/dL — AB (ref <150)
VLDL: 35 mg/dL (ref 0–40)

## 2014-09-19 LAB — HEMOGLOBIN A1C
Hgb A1c MFr Bld: 6 % — ABNORMAL HIGH (ref <5.7)
Mean Plasma Glucose: 126 mg/dL — ABNORMAL HIGH (ref <117)

## 2014-09-19 LAB — IRON AND TIBC
%SAT: 35 % (ref 20–55)
Iron: 112 ug/dL (ref 42–165)
TIBC: 320 ug/dL (ref 215–435)
UIBC: 208 ug/dL (ref 125–400)

## 2014-09-19 LAB — MAGNESIUM: MAGNESIUM: 2 mg/dL (ref 1.5–2.5)

## 2014-09-19 LAB — VITAMIN B12: VITAMIN B 12: 372 pg/mL (ref 211–911)

## 2014-09-19 NOTE — Progress Notes (Signed)
Patient ID: Parker Humphrey, male   DOB: Jul 05, 1950, 64 y.o.   MRN: [id number]  Annual Comprehensive Examination  This very nice 64 y.o. MWM presents for complete physical.  Patient has been followed for HTN, Prediabetes, Hyperlipidemia, and Vitamin D Deficiency.   Labile HTN predates since 2007 and is monitored expectantly.. Patient's BP has been controlled at home.Today's BP: 138/80 mmHg. Patient denies any cardiac symptoms as chest pain, palpitations, shortness of breath, dizziness or ankle swelling.   Patient's hyperlipidemia is controlled with diet and medications. Patient denies myalgias or other medication SE's. Today's lipids are not at goal with total Cholesterol 185; HDL 37; elevated LDL 113 and elevated  Triglycerides 177.     Patient has prediabetes since Apr 2012 with an A1c 6.1% and patient denies reactive hypoglycemic symptoms, visual blurring, diabetic polys or paresthesias. Today's A1c is not at goal - A1c  6.0%.      Finally, patient has history of Vitamin D Deficiency of 37 in 2008 and today's vitamin D is 54.      Medication Sig  . aspirin 81 MG tablet Take 81 mg by mouth daily.  . Cholecalciferol (VITAMIN D PO) Take 2,000 Units by mouth 2 (two) times daily.  Marland Kitchen ezetimibe (ZETIA) 10 MG tablet Take 1 tablet (10 mg total) by mouth daily.  . Misc Natural Products (COSAMIN ASU ADVANCED FORMULA) CAPS TAKE 4 CAPSULES BY MOUTH TWICE A DAY   No facility-administered medications prior to visit.   Allergies  Allergen Reactions  . Pravastatin   . Red Yeast Rice [Cholestin] Rash   Past Medical History  Diagnosis Date  . Hypertension 2009    labile-monitoring expectantly  . Hyperlipidemia 2005  . Diabetes mellitus without complication 04/7406    X4G 6.1% preDiabetes  . H/O vasectomy 82  . Vitamin D deficiency 2008    Vit D = 37  . Hypogonadism male Oct 2014    Testosterone = 255 (Patient declined treatment)   Health Maintenance  Topic Date Due  . COLONOSCOPY  02/26/2003   . ZOSTAVAX  02/25/2013  . INFLUENZA VACCINE  11/13/2014  . TETANUS/TDAP  08/01/2019  . HIV Screening  Completed   Immunization History  Administered Date(s) Administered  . DTaP 04/14/2009  . Influenza Split 01/12/2013  . PPD Test 09/14/2013, 09/19/2014  . Pneumococcal Polysaccharide-23 04/14/1997  . Td 04/14/1997  . Tdap 07/31/2009   Past Surgical History  Procedure Laterality Date  . Knee arthroscopy Left 2010  . Vasectomy Bilateral 1987  . Inguinal hernia repair Bilateral 2009  . Inguinal hernia repair Left 1997  . Inguinal hernia repair Left 1998  . Colonoscopy N/A 2006   Family History  Problem Relation Age of Onset  . Cancer Father   . Heart disease Father   . Diabetes Brother   . Hyperlipidemia Brother    History   Social History  . Marital Status: Married    Spouse Name: N/A  . Number of Children: N/A  . Years of Education: N/A   Occupational History  . Not on file.   Social History Main Topics  . Smoking status: Former Smoker -- 10 years    Quit date: 09/15/1991  . Smokeless tobacco: Never Used  . Alcohol Use: No  . Drug Use: No  . Sexual Activity: Yes   Other Topics Concern  . Not on file   Social History Narrative    ROS Constitutional: Denies fever, chills, weight loss/gain, headaches, insomnia,  night sweats or change in appetite.  Does c/o fatigue. Eyes: Denies redness, blurred vision, diplopia, discharge, itchy or watery eyes.  ENT: Denies discharge, congestion, post nasal drip, epistaxis, sore throat, earache, hearing loss, dental pain, Tinnitus, Vertigo, Sinus pain or snoring.  Cardio: Denies chest pain, palpitations, irregular heartbeat, syncope, dyspnea, diaphoresis, orthopnea, PND, claudication or edema Respiratory: denies cough, dyspnea, DOE, pleurisy, hoarseness, laryngitis or wheezing.  Gastrointestinal: Denies dysphagia, heartburn, reflux, water brash, pain, cramps, nausea, vomiting, bloating, diarrhea, constipation, hematemesis,  melena, hematochezia, jaundice or hemorrhoids Genitourinary: Denies dysuria, frequency, urgency, nocturia, hesitancy, discharge, hematuria or flank pain Musculoskeletal: Denies arthralgia, myalgia, stiffness, Jt. Swelling, pain, limp or strain/sprain. Denies Falls. Skin: Denies puritis, rash, hives, warts, acne, eczema or change in skin lesion Neuro: No weakness, tremor, incoordination, spasms, paresthesia or pain Psychiatric: Denies confusion, memory loss or sensory loss. Denies Depression. Endocrine: Denies change in weight, skin, hair change, nocturia, and paresthesia, diabetic polys, visual blurring or hyper / hypo glycemic episodes.  Heme/Lymph: No excessive bleeding, bruising or enlarged lymph nodes.  Physical Exam  BP 138/80   Pulse 72  Temp 97.7 F Resp 16  Ht 6' 0.75"   Wt 193 lb     BMI 25.63   General Appearance: Well nourished, in no apparent distress. Eyes: PERRLA, EOMs, conjunctiva no swelling or erythema, normal fundi and vessels. Sinuses: No frontal/maxillary tenderness ENT/Mouth: EACs patent / TMs  nl. Nares clear without erythema, swelling, mucoid exudates. Oral hygiene is good. No erythema, swelling, or exudate. Tongue normal, non-obstructing. Tonsils not swollen or erythematous. Hearing normal.  Neck: Supple, thyroid normal. No bruits, nodes or JVD. Respiratory: Respiratory effort normal.  BS equal and clear bilateral without rales, rhonci, wheezing or stridor. Cardio: Heart sounds are normal with regular rate and rhythm and no murmurs, rubs or gallops. Peripheral pulses are normal and equal bilaterally without edema. No aortic or femoral bruits. Chest: symmetric with normal excursions and percussion.  Abdomen: Flat, soft, with bowel sounds. Nontender, no guarding, rebound, hernias, masses, or organomegaly.  Lymphatics: Non tender without lymphadenopathy.  Genitourinary: No hernias.Testes nl. DRE - prostate nl for age - smooth & firm w/o nodules. Musculoskeletal:  Full ROM all peripheral extremities, joint stability, 5/5 strength, and normal gait. Skin: Warm and dry without rashes, lesions, cyanosis, clubbing or  ecchymosis.  Neuro: Cranial nerves intact, reflexes equal bilaterally. Normal muscle tone, no cerebellar symptoms. Sensation intact.  Pysch: Awake and oriented X 3 with normal affect, insight and judgment appropriate.   Assessment and Plan  1. Essential hypertension  - Microalbumin / creatinine urine ratio - EKG 12-Lead - [place], RETROPERITNL ABD,  LTD - TSH  2. Hyperlipidemia  - Lipid panel  3. Prediabetes  - Hemoglobin A1c - Insulin, random  4. Vitamin D deficiency  - Vit D  25 hydroxy (rtn osteoporosis monitoring)  5. Screening for rectal cancer  - POC Hemoccult Bld/Stl (3-Cd Home Screen); Future  6. Prostate cancer screening  - PSA  7. Other fatigue  - Vitamin B12 - Testosterone - Iron and TIBC  8. Medication management  - Urine Microscopic - CBC with Differential/Platelet - BASIC METABOLIC PANEL WITH GFR - Hepatic function panel - Magnesium  9. Screening examination for pulmonary tuberculosis  - PPD   Continue prudent diet as discussed, weight control, BP monitoring, regular exercise, and medications as discussed.  Discussed med effects and SE's. Routine screening labs and tests as requested with regular follow-up as recommended.  Over 40 minutes of exam, counseling &  chart review was performed

## 2014-09-19 NOTE — Patient Instructions (Signed)

## 2014-09-20 LAB — PSA: PSA: 2.62 ng/mL (ref <=4.00)

## 2014-09-20 LAB — MICROALBUMIN / CREATININE URINE RATIO
CREATININE, URINE: 185.1 mg/dL
MICROALB/CREAT RATIO: 7.6 mg/g (ref 0.0–30.0)
Microalb, Ur: 1.4 mg/dL (ref <2.0)

## 2014-09-20 LAB — INSULIN, RANDOM: INSULIN: 7.3 u[IU]/mL (ref 2.0–19.6)

## 2014-09-20 LAB — URINALYSIS, MICROSCOPIC ONLY
Bacteria, UA: NONE SEEN
CRYSTALS: NONE SEEN
Casts: NONE SEEN
Squamous Epithelial / LPF: NONE SEEN

## 2014-09-20 LAB — VITAMIN D 25 HYDROXY (VIT D DEFICIENCY, FRACTURES): Vit D, 25-Hydroxy: 54 ng/mL (ref 30–100)

## 2014-09-20 LAB — TESTOSTERONE: Testosterone: 460 ng/dL (ref 300–890)

## 2014-09-24 ENCOUNTER — Encounter: Payer: Self-pay | Admitting: Internal Medicine

## 2015-03-25 ENCOUNTER — Other Ambulatory Visit: Payer: Self-pay | Admitting: Internal Medicine

## 2015-03-26 ENCOUNTER — Ambulatory Visit: Payer: Self-pay | Admitting: Internal Medicine

## 2015-05-03 ENCOUNTER — Other Ambulatory Visit: Payer: Self-pay | Admitting: Physician Assistant

## 2015-05-13 ENCOUNTER — Other Ambulatory Visit: Payer: Self-pay | Admitting: Physician Assistant

## 2015-10-02 ENCOUNTER — Other Ambulatory Visit: Payer: Self-pay | Admitting: Internal Medicine

## 2015-10-02 ENCOUNTER — Ambulatory Visit (INDEPENDENT_AMBULATORY_CARE_PROVIDER_SITE_OTHER): Payer: BLUE CROSS/BLUE SHIELD | Admitting: Internal Medicine

## 2015-10-02 ENCOUNTER — Encounter: Payer: Self-pay | Admitting: Internal Medicine

## 2015-10-02 VITALS — BP 130/76 | HR 64 | Temp 97.3°F | Resp 16 | Ht 72.5 in | Wt 194.6 lb

## 2015-10-02 DIAGNOSIS — Z111 Encounter for screening for respiratory tuberculosis: Secondary | ICD-10-CM | POA: Diagnosis not present

## 2015-10-02 DIAGNOSIS — E559 Vitamin D deficiency, unspecified: Secondary | ICD-10-CM

## 2015-10-02 DIAGNOSIS — Z0001 Encounter for general adult medical examination with abnormal findings: Secondary | ICD-10-CM

## 2015-10-02 DIAGNOSIS — Z125 Encounter for screening for malignant neoplasm of prostate: Secondary | ICD-10-CM | POA: Diagnosis not present

## 2015-10-02 DIAGNOSIS — Z1212 Encounter for screening for malignant neoplasm of rectum: Secondary | ICD-10-CM

## 2015-10-02 DIAGNOSIS — Z79899 Other long term (current) drug therapy: Secondary | ICD-10-CM | POA: Diagnosis not present

## 2015-10-02 DIAGNOSIS — Z136 Encounter for screening for cardiovascular disorders: Secondary | ICD-10-CM

## 2015-10-02 DIAGNOSIS — E782 Mixed hyperlipidemia: Secondary | ICD-10-CM

## 2015-10-02 DIAGNOSIS — R7303 Prediabetes: Secondary | ICD-10-CM

## 2015-10-02 DIAGNOSIS — Z1211 Encounter for screening for malignant neoplasm of colon: Secondary | ICD-10-CM

## 2015-10-02 DIAGNOSIS — I10 Essential (primary) hypertension: Secondary | ICD-10-CM

## 2015-10-02 DIAGNOSIS — R5383 Other fatigue: Secondary | ICD-10-CM

## 2015-10-02 DIAGNOSIS — Z0000 Encounter for general adult medical examination without abnormal findings: Secondary | ICD-10-CM

## 2015-10-02 LAB — CBC WITH DIFFERENTIAL/PLATELET
BASOS ABS: 60 {cells}/uL (ref 0–200)
BASOS PCT: 1 %
EOS PCT: 3 %
Eosinophils Absolute: 180 cells/uL (ref 15–500)
HCT: 47.8 % (ref 38.5–50.0)
HEMOGLOBIN: 15.8 g/dL (ref 13.2–17.1)
LYMPHS ABS: 1260 {cells}/uL (ref 850–3900)
Lymphocytes Relative: 21 %
MCH: 29 pg (ref 27.0–33.0)
MCHC: 33.1 g/dL (ref 32.0–36.0)
MCV: 87.9 fL (ref 80.0–100.0)
MONOS PCT: 7 %
MPV: 10.4 fL (ref 7.5–12.5)
Monocytes Absolute: 420 cells/uL (ref 200–950)
NEUTROS ABS: 4080 {cells}/uL (ref 1500–7800)
Neutrophils Relative %: 68 %
PLATELETS: 272 10*3/uL (ref 140–400)
RBC: 5.44 MIL/uL (ref 4.20–5.80)
RDW: 13.5 % (ref 11.0–15.0)
WBC: 6 10*3/uL (ref 3.8–10.8)

## 2015-10-02 LAB — HEPATIC FUNCTION PANEL
ALBUMIN: 4.3 g/dL (ref 3.6–5.1)
ALT: 26 U/L (ref 9–46)
AST: 20 U/L (ref 10–35)
Alkaline Phosphatase: 52 U/L (ref 40–115)
BILIRUBIN TOTAL: 0.6 mg/dL (ref 0.2–1.2)
Bilirubin, Direct: 0.1 mg/dL (ref <=0.2)
Indirect Bilirubin: 0.5 mg/dL (ref 0.2–1.2)
TOTAL PROTEIN: 6.8 g/dL (ref 6.1–8.1)

## 2015-10-02 LAB — MAGNESIUM: MAGNESIUM: 2 mg/dL (ref 1.5–2.5)

## 2015-10-02 LAB — LIPID PANEL
Cholesterol: 192 mg/dL (ref 125–200)
HDL: 43 mg/dL (ref >=40)
LDL Cholesterol: 99 mg/dL (ref <130)
Total CHOL/HDL Ratio: 4.5 Ratio (ref <=5.0)
Triglycerides: 250 mg/dL — ABNORMAL HIGH (ref <150)
VLDL: 50 mg/dL — ABNORMAL HIGH (ref <30)

## 2015-10-02 LAB — IRON AND TIBC
%SAT: 32 % (ref 15–60)
IRON: 98 ug/dL (ref 50–180)
TIBC: 305 ug/dL (ref 250–425)
UIBC: 207 ug/dL (ref 125–400)

## 2015-10-02 LAB — TSH: TSH: 2.23 m[IU]/L (ref 0.40–4.50)

## 2015-10-02 LAB — BASIC METABOLIC PANEL WITH GFR
BUN: 21 mg/dL (ref 7–25)
CHLORIDE: 102 mmol/L (ref 98–110)
CO2: 24 mmol/L (ref 20–31)
CREATININE: 0.83 mg/dL (ref 0.70–1.25)
Calcium: 9.3 mg/dL (ref 8.6–10.3)
GFR, Est Non African American: >89 mL/min (ref >=60)
GLUCOSE: 99 mg/dL (ref 65–99)
Potassium: 4.5 mmol/L (ref 3.5–5.3)
Sodium: 139 mmol/L (ref 135–146)

## 2015-10-02 LAB — HEMOGLOBIN A1C
Hgb A1c MFr Bld: 6 % — ABNORMAL HIGH (ref <5.7)
MEAN PLASMA GLUCOSE: 126 mg/dL

## 2015-10-02 LAB — VITAMIN B12: Vitamin B-12: 309 pg/mL (ref 200–1100)

## 2015-10-02 NOTE — Patient Instructions (Signed)

## 2015-10-02 NOTE — Progress Notes (Signed)
Patient ID: Parker Humphrey, male   DOB: Mar 08, 1951, 65 y.o.   MRN: [id number]  [facility]   Parker Humphrey, M.D.    Parker Humphrey. Parker Humphrey, P.A.-C      Parker Humphrey, P.A.-C   [facility]                [address] [place], [place] [id number] Telephone [phone] Telefax [phone] _________________________________  Annual  Screening/Preventative Visit And Comprehensive Evaluation & Examination     This very nice 65 y.o. MWM presents for a Wellness/Preventative Visit & comprehensive evaluation and management of multiple medical co-morbidities.  Patient has been followed for labile HTN, Prediabetes, Hyperlipidemia and Vitamin D Deficiency.     Patient has hx/o labile HTN predates since 2002 And has been followed expectantly. Patient's BP has been controlled at home.Today's BP: 130/76 mmHg. Patient denies any cardiac symptoms as chest pain, palpitations, shortness of breath, dizziness or ankle swelling.     Patient's hyperlipidemia is not controlled with diet and medications. Patient denies myalgias or other medication SE's. Last lipids were not at goal with T Chol 185, HDL 37, sl elevated Trig 177 and elevated LDL 113 in June 2016.  Patient has reported intolerance to Pravastatin and is on Zetia and has not followed up this past year to monitor his lipids.       Patient has prediabetes since 2012 with A1c 6.1%  and patient denies reactive hypoglycemic symptoms, visual blurring, diabetic polys or paresthesias. Last A1c was 6.0% in June 2016.       Finally, patient has history of Vitamin D Deficiency of "64" in 2008 and last vitamin D was 54 in June 2016.  Medication Sig  . aspirin 81 MG Take 81 mg by mouth daily.  Parker Humphrey D  Take 2,000 Units by mouth 2 (two) times daily.  Parker Humphrey ASU ADVANCED FORMULA TAKE 4 CAPSULES BY MOUTH TWICE A DAY  . ZETIA 10 MG  TAKE 1 TABLET (10 MG TOTAL) BY MOUTH DAILY.     Allergies  Allergen Reactions  . Pravastatin   . Red Yeast Rice [Parker Humphrey] Rash   Past Medical History  Diagnosis Date  . Hypertension 2009    labile-monitoring expectantly  . Hyperlipidemia 2005  . Diabetes mellitus without complication 123456    123456 6.1% preDiabetes  . H/O vasectomy 65  . Vitamin D deficiency 2008    Vit D = 37  . Hypogonadism male Oct 2014    Testosterone = 255 (Patient declined treatment)   Health Maintenance  Topic Date Due  . ZOSTAVAX  02/25/2013  . COLONOSCOPY  09/24/2014 - completed  . INFLUENZA VACCINE  11/13/2015  . TETANUS/TDAP  08/01/2019  . Hepatitis C Screening  Completed  . HIV Screening  Completed   Immunization History  Administered Date(s) Administered  . DTaP 04/14/2009  . Influenza Split 01/12/2013  . PPD Test 09/14/2013, 09/19/2014  . Pneumococcal Polysaccharide-23 04/14/1997  . Td 04/14/1997  . Tdap 07/31/2009    Family History  Problem Relation Age of Onset  . Cancer Father   . Heart disease Father   . Diabetes Brother   . Hyperlipidemia Brother    Social History   Social History  . Marital Status: Married    Spouse Name: N/A  . Number of Children: N/A  . Years of Education: N/A   Occupational History  .  Retired [employer]   Social History Main Topics  . Smoking status: Former Smoker -- 10 years    Quit date: 09/15/1991  . Smokeless tobacco: Never Used  . Alcohol Use: No  . Drug Use: No  . Sexual Activity: Yes      ROS Constitutional: Denies fever, chills, weight loss/gain, headaches, insomnia,  night sweats or change in appetite. Does c/o fatigue. Eyes: Denies redness, blurred vision, diplopia, discharge, itchy or watery eyes.  ENT: Denies discharge, congestion, post nasal drip, epistaxis, sore throat, earache, hearing loss, dental pain, Tinnitus, Vertigo, Sinus pain or snoring.  Cardio: Denies chest pain, palpitations, irregular heartbeat, syncope, dyspnea, diaphoresis, orthopnea, PND,  claudication or edema Respiratory: denies cough, dyspnea, DOE, pleurisy, hoarseness, laryngitis or wheezing.  Gastrointestinal: Denies dysphagia, heartburn, reflux, water brash, pain, cramps, nausea, vomiting, bloating, diarrhea, constipation, hematemesis, melena, hematochezia, jaundice or hemorrhoids Genitourinary: Denies dysuria, frequency, urgency, nocturia, hesitancy, discharge, hematuria or flank pain Musculoskeletal: Denies arthralgia, myalgia, stiffness, Jt. Swelling, pain, limp or strain/sprain. Denies Falls. Skin: Denies puritis, rash, hives, warts, acne, eczema or change in skin lesion Neuro: No weakness, tremor, incoordination, spasms, paresthesia or pain Psychiatric: Denies confusion, memory loss or sensory loss. Denies Depression. Endocrine: Denies change in weight, skin, hair change, nocturia, and paresthesia, diabetic polys, visual blurring or hyper / hypo glycemic episodes.  Heme/Lymph: No excessive bleeding, bruising or enlarged lymph nodes.  Physical Exam  BP 130/76 mmHg  Pulse 64  Temp(Src) 97.3 F (36.3 C)  Resp 16  Ht 6' 0.5" (1.842 m)  Wt 194 lb 9.6 oz (88.27 kg)  BMI 26.02 kg/m2  General Appearance: Well nourished, in no apparent distress. Eyes: PERRLA, EOMs, conjunctiva no swelling or erythema, normal fundi and vessels. Sinuses: No frontal/maxillary tenderness ENT/Mouth: EACs patent / TMs  nl. Nares clear without erythema, swelling, mucoid exudates. Oral hygiene is good. No erythema, swelling, or exudate. Tongue normal, non-obstructing. Tonsils not swollen or erythematous. Hearing normal.  Neck: Supple, thyroid normal. No bruits, nodes or JVD. Respiratory: Respiratory effort normal.  BS equal and clear bilateral without rales, rhonci, wheezing or stridor. Cardio: Heart sounds are normal with regular rate and rhythm and no murmurs, rubs or gallops. Peripheral pulses are normal and equal bilaterally without edema. No aortic or femoral bruits. Chest: symmetric with  normal excursions and percussion.  Abdomen: Soft, with Nl bowel sounds. Nontender, no guarding, rebound, hernias, masses, or organomegaly.  Lymphatics: Non tender without lymphadenopathy.  Genitourinary: No hernias.Testes nl. DRE - prostate nl for age - smooth & firm w/o nodules. Musculoskeletal: Full ROM all peripheral extremities, joint stability, 5/5 strength, and normal gait. Skin: Warm and dry without rashes, lesions, cyanosis, clubbing or  ecchymosis.  Neuro: Cranial nerves intact, reflexes equal bilaterally. Normal muscle tone, no cerebellar symptoms. Sensation intact.  Pysch: Alert and oriented X 3 with normal affect, insight and judgment appropriate.   Assessment and Plan  1. Annual Preventative/Screening Exam   - Microalbumin / creatinine urine ratio - EKG 12-Lead - [place], RETROPERITNL ABD,  LTD - POC Hemoccult Bld/Stl  - Urinalysis, Routine w reflex microscopic  - Vitamin B12 - Iron and TIBC - PSA - Testosterone - CBC with Differential/Platelet - BASIC METABOLIC PANEL WITH GFR - Hepatic function panel - Magnesium - Lipid panel - TSH - Hemoglobin A1c - Insulin, random - VITAMIN D 25 Hydroxy   2. Essential hypertension  - Microalbumin / creatinine urine ratio - EKG 12-Lead - [place], RETROPERITNL ABD,  LTD - TSH  3. Hyperlipidemia  -  Lipid panel - TSH  4. Prediabetes  - Hemoglobin A1c - Insulin, random  5. Vitamin D deficiency  - VITAMIN D 25 Hydroxy   6. Screening for rectal cancer  - POC Hemoccult Bld/Stl   7. Prostate cancer screening  - PSA  8. Other fatigue  - Vitamin B12 - Iron and TIBC - Testosterone - CBC with Differential/Platelet - TSH  9. Medication management  - Urinalysis, Routine w reflex microscopic - CBC with Differential/Platelet - BASIC METABOLIC PANEL WITH GFR - Hepatic function panel - Magnesium  10. Screening for AAA (aortic abdominal aneurysm)  11. Screening for ischemic heart disease   Continue prudent diet as  discussed, weight control, BP monitoring, regular exercise, and medications as discussed.  Discussed med effects and SE's. Routine screening labs and tests as requested with regular follow-up as recommended. Over 40 minutes of exam, counseling, chart review and high complex critical decision making was performed

## 2015-10-03 LAB — URINALYSIS, ROUTINE W REFLEX MICROSCOPIC
Bilirubin Urine: NEGATIVE
GLUCOSE, UA: NEGATIVE
HGB URINE DIPSTICK: NEGATIVE
KETONES UR: NEGATIVE
LEUKOCYTES UA: NEGATIVE
Nitrite: NEGATIVE
Specific Gravity, Urine: 1.034 (ref 1.001–1.035)
pH: 7 (ref 5.0–8.0)

## 2015-10-03 LAB — MICROALBUMIN / CREATININE URINE RATIO
Creatinine, Urine: 354 mg/dL (ref 20–370)
Microalb Creat Ratio: 3 mcg/mg creat (ref <30)
Microalb, Ur: 1.1 mg/dL

## 2015-10-03 LAB — VITAMIN D 25 HYDROXY (VIT D DEFICIENCY, FRACTURES): Vit D, 25-Hydroxy: 78 ng/mL (ref 30–100)

## 2015-10-03 LAB — PSA: PSA: 2.82 ng/mL (ref <=4.00)

## 2015-10-03 LAB — TESTOSTERONE: TESTOSTERONE: 345 ng/dL (ref 250–827)

## 2015-10-03 LAB — INSULIN, RANDOM: INSULIN: 4.6 u[IU]/mL (ref 2.0–19.6)

## 2015-10-04 LAB — TB SKIN TEST
INDURATION: 0 mm
TB SKIN TEST: NEGATIVE

## 2015-10-18 ENCOUNTER — Other Ambulatory Visit: Payer: Self-pay | Admitting: *Deleted

## 2015-10-18 DIAGNOSIS — Z1212 Encounter for screening for malignant neoplasm of rectum: Secondary | ICD-10-CM

## 2015-10-18 DIAGNOSIS — Z0001 Encounter for general adult medical examination with abnormal findings: Secondary | ICD-10-CM

## 2015-10-18 LAB — POC HEMOCCULT BLD/STL (HOME/3-CARD/SCREEN)
Card #2 Fecal Occult Blod, POC: NEGATIVE
FECAL OCCULT BLD: NEGATIVE
Fecal Occult Blood, POC: NEGATIVE

## 2015-12-03 ENCOUNTER — Encounter: Payer: Self-pay | Admitting: Internal Medicine

## 2015-12-10 ENCOUNTER — Other Ambulatory Visit: Payer: Self-pay | Admitting: Internal Medicine

## 2016-01-23 ENCOUNTER — Ambulatory Visit (INDEPENDENT_AMBULATORY_CARE_PROVIDER_SITE_OTHER): Payer: BLUE CROSS/BLUE SHIELD | Admitting: Internal Medicine

## 2016-01-23 ENCOUNTER — Other Ambulatory Visit: Payer: Self-pay | Admitting: Internal Medicine

## 2016-01-23 VITALS — BP 128/82 | HR 56 | Temp 97.0°F | Resp 16 | Ht 72.5 in | Wt 198.6 lb

## 2016-01-23 DIAGNOSIS — B029 Zoster without complications: Secondary | ICD-10-CM

## 2016-01-23 DIAGNOSIS — N6322 Unspecified lump in the left breast, upper inner quadrant: Secondary | ICD-10-CM

## 2016-01-23 DIAGNOSIS — N6320 Unspecified lump in the left breast, unspecified quadrant: Secondary | ICD-10-CM

## 2016-01-23 MED ORDER — HYDROCODONE-ACETAMINOPHEN 5-325 MG PO TABS: ORAL_TABLET | ORAL | 0 refills | Status: AC

## 2016-01-23 MED ORDER — PREDNISONE 20 MG PO TABS: ORAL_TABLET | ORAL | 0 refills | Status: AC

## 2016-01-23 MED ORDER — ACYCLOVIR 800 MG PO TABS: ORAL_TABLET | ORAL | 0 refills | Status: DC

## 2016-01-23 NOTE — Patient Instructions (Signed)

## 2016-01-24 ENCOUNTER — Encounter: Payer: Self-pay | Admitting: Internal Medicine

## 2016-01-26 ENCOUNTER — Encounter: Payer: Self-pay | Admitting: Internal Medicine

## 2016-01-26 NOTE — Progress Notes (Signed)
[place] [facility]   Unk Pinto, M.D.    Uvaldo Bristle. Silverio Lay, P.A.-C      Starlyn Skeans, P.A.-C  [facility]                [address] [place], [place] [id number] Telephone [phone] Telefax [phone] Subjective:    Patient ID: Parker Humphrey, male    DOB: Jun 13, 1950, 65 y.o.   MRN: [id number]  HPI  This very nice 65 yo MWM presents with a 5 day hx/o burning pain over the Lt T4 dermatome in the back & flank area and awoke today to discover a rash and surprising the pain has diminished and all but resolved with the appearance of the rash. He also has noted a non tender mobile lump in his lt breast .   Medication Sig  . aspirin 81 MG tablet Take 81 mg by mouth daily.  . Cholecalciferol (VITAMIN D PO) Take 2,000 Units by mouth 2 (two) times daily.  Marland Kitchen ezetimibe (ZETIA) 10 MG tablet TAKE 1 TABLET (10 MG TOTAL) BY MOUTH DAILY.  Marland Kitchen Misc Natural Products (COSAMIN ASU ADVANCED FORMULA) CAPS TAKE 4 CAPSULES BY MOUTH TWICE A DAY   Allergies  Allergen Reactions  . Pravastatin   . Red Yeast Rice [Cholestin] Rash   Past Medical History:  Diagnosis Date  . Diabetes mellitus without complication ([place]) 123456   A1c 6.1% preDiabetes  . H/O vasectomy 10  . Hyperlipidemia 2005  . Hypertension 2009   labile-monitoring expectantly  . Hypogonadism male Oct 2014   Testosterone = 255 (Patient declined treatment)  . Vitamin D deficiency 2008   Vit D = 37   Past Surgical History:  Procedure Laterality Date  . COLONOSCOPY N/A 2006  . INGUINAL HERNIA REPAIR Bilateral 2009  . INGUINAL HERNIA REPAIR Left 1997  . INGUINAL HERNIA REPAIR Left 1998  . KNEE ARTHROSCOPY Left 2010  . VASECTOMY Bilateral 1987   Review of Systems  10 point systems review negative except as above.   Objective:   Physical Exam  BP 128/82   Pulse (!) 56   Temp 97 F (36.1 C)   Resp 16   Ht 6' 0.5" (1.842 m)   Wt 198 lb 9.6 oz  (90.1 kg)   BMI 26.56 kg/m   Skin - Classic shingles rash w/o vesiculation in the Lt T4 dermatone mid axillary area.   HEENT - Eac's patent. TM's Nl. EOM's full. PERRLA. NasoOroPharynx clear. Neck - supple. Nl Thyroid. Carotids 2+ & No bruits, nodes, JVD Chest - Clear equal BS w/o Rales, rhonchi, wheezes. Breast - #2 firm ~ 1.5-2.0 cm nin tender mobile subcutaneous nodules at ~11 o'clock Cor - Nl HS. RRR w/o sig MGR. PP 1(+). No edema. Abd - No palpable organomegaly, masses or tenderness. BS nl. MS- FROM w/o deformities. Muscle power, tone and bulk Nl. Gait Nl. Neuro - No obvious Cr N abnormalities. Sensory, motor and Cerebellar functions appear Nl w/o focal abnormalities. Psyche - Mental status normal & appropriate.  No delusions, ideations or obvious mood abnormalities.    Assessment & Plan:   1. Herpes zoster without complication  - predniSONE (DELTASONE) 20 MG tablet; 1 tab 3 x day for 3 days, then 1 tab 2 x day for 3 days, then 1 tab 1 x day for 5 days  Dispense: 30 tablet; Refill: 0  - Rx to  hold -NORCO 5-325 MG tab; Take 1/2 to 1 tab every 3 to 4 hours if needed for severe pain  Dispense: 30 tablet;  - acyclovir (ZOVIRAX) 800 MG tablet; Take 1 tablet 3 x/day w/meals and 2 tablets at bedtime  Dispense: 50 tablet;  - Doiscussed meds and side effects  - Recc Zostavax in ~ 3 mo  2. Breast lump on left side at 11 o'clock position  - MM Digital Diagnostic Bilat; Future

## 2016-01-29 ENCOUNTER — Ambulatory Visit
Admission: RE | Admit: 2016-01-29 | Discharge: 2016-01-29 | Disposition: A | Discharge status: Home / Self Care | Payer: BLUE CROSS/BLUE SHIELD | Source: Ambulatory Visit | Attending: Internal Medicine | Admitting: Internal Medicine

## 2016-01-29 ENCOUNTER — Other Ambulatory Visit: Payer: Self-pay | Admitting: Internal Medicine

## 2016-01-29 DIAGNOSIS — N6322 Unspecified lump in the left breast, upper inner quadrant: Secondary | ICD-10-CM

## 2016-01-29 DIAGNOSIS — N6320 Unspecified lump in the left breast, unspecified quadrant: Secondary | ICD-10-CM

## 2016-04-02 ENCOUNTER — Ambulatory Visit: Payer: Self-pay | Admitting: Internal Medicine

## 2016-04-17 ENCOUNTER — Encounter: Payer: Self-pay | Admitting: Internal Medicine

## 2016-04-17 ENCOUNTER — Ambulatory Visit (INDEPENDENT_AMBULATORY_CARE_PROVIDER_SITE_OTHER): Payer: BLUE CROSS/BLUE SHIELD | Admitting: Internal Medicine

## 2016-04-17 VITALS — BP 122/70 | HR 64 | Temp 98.4°F | Resp 16 | Ht 72.05 in | Wt 198.0 lb

## 2016-04-17 DIAGNOSIS — Z79899 Other long term (current) drug therapy: Secondary | ICD-10-CM | POA: Diagnosis not present

## 2016-04-17 DIAGNOSIS — B028 Zoster with other complications: Secondary | ICD-10-CM

## 2016-04-17 DIAGNOSIS — E782 Mixed hyperlipidemia: Secondary | ICD-10-CM | POA: Diagnosis not present

## 2016-04-17 DIAGNOSIS — I10 Essential (primary) hypertension: Secondary | ICD-10-CM | POA: Diagnosis not present

## 2016-04-17 DIAGNOSIS — R7303 Prediabetes: Secondary | ICD-10-CM | POA: Diagnosis not present

## 2016-04-17 DIAGNOSIS — E559 Vitamin D deficiency, unspecified: Secondary | ICD-10-CM | POA: Diagnosis not present

## 2016-04-17 LAB — LIPID PANEL
CHOL/HDL RATIO: 4.9 ratio (ref <5.0)
CHOLESTEROL: 192 mg/dL (ref <200)
HDL: 39 mg/dL — ABNORMAL LOW (ref >40)
LDL CALC: 106 mg/dL — AB (ref <100)
TRIGLYCERIDES: 236 mg/dL — AB (ref <150)
VLDL: 47 mg/dL — ABNORMAL HIGH (ref <30)

## 2016-04-17 MED ORDER — EZETIMIBE 10 MG PO TABS: 10.0000 mg | ORAL_TABLET | Freq: Every day | ORAL | 1 refills | Status: DC

## 2016-04-17 NOTE — Addendum Note (Signed)
Addended by: Starlyn Skeans A on: 04/17/2016 04:54 PM   Modules accepted: Orders

## 2016-04-17 NOTE — Progress Notes (Signed)
Assessment and Plan:  Hypertension:  -Continue medication,  -monitor blood pressure at home.  -Continue DASH diet.   -Reminder to go to the ER if any CP, SOB, nausea, dizziness, severe HA, changes vision/speech, left arm numbness and tingling, and jaw pain.  Cholesterol: -Continue diet and exercise.  -Check cholesterol.   Pre-diabetes: -Continue diet and exercise.  -Check A1C  Vitamin D Def: -continue medications.   Herpes Zoster -resolved  Continue diet and meds as discussed. Further disposition pending results of labs.  HPI 66 y.o. male  presents for 3 month follow up with hypertension, hyperlipidemia, prediabetes and vitamin D.   His blood pressure has been controlled at home, today their BP is BP: 122/70.   He does not workout. He denies chest pain, shortness of breath, dizziness.   He is on cholesterol medication and denies myalgias. His cholesterol is at goal. The cholesterol last visit was:   Lab Results  Component Value Date   CHOL 192 10/02/2015   HDL 43 10/02/2015   LDLCALC 99 10/02/2015   TRIG 250 (H) 10/02/2015   CHOLHDL 4.5 10/02/2015     He has been working on diet and exercise for prediabetes, and denies foot ulcerations, hyperglycemia, hypoglycemia , increased appetite, nausea, paresthesia of the feet, polydipsia, polyuria, visual disturbances, vomiting and weight loss. Last A1C in the office was:  Lab Results  Component Value Date   HGBA1C 6.0 (H) 10/02/2015    Patient is on Vitamin D supplement.  Lab Results  Component Value Date   VD25OH 78 10/02/2015      Patient reports that he has healed from his shingles episode.  He still has some sensitivity in the torso but the rash is totally gone.   Current Medications:  Current Outpatient Prescriptions on File Prior to Visit  Medication Sig Dispense Refill  . aspirin 81 MG tablet Take 81 mg by mouth daily.    . Cholecalciferol (VITAMIN D PO) Take 2,000 Units by mouth 2 (two) times daily.    Marland Kitchen  ezetimibe (ZETIA) 10 MG tablet TAKE 1 TABLET (10 MG TOTAL) BY MOUTH DAILY. 90 tablet 1  . Misc Natural Products (COSAMIN ASU ADVANCED FORMULA) CAPS TAKE 4 CAPSULES BY MOUTH TWICE A DAY 90 capsule 5   No current facility-administered medications on file prior to visit.     Medical History:  Past Medical History:  Diagnosis Date  . Diabetes mellitus without complication ([place]) 123456   A1c 6.1% preDiabetes  . H/O vasectomy 53  . Hyperlipidemia 2005  . Hypertension 2009   labile-monitoring expectantly  . Hypogonadism male Oct 2014   Testosterone = 255 (Patient declined treatment)  . Vitamin D deficiency 2008   Vit D = 37    Allergies:  Allergies  Allergen Reactions  . Pravastatin   . Red Yeast Rice [Cholestin] Rash     Review of Systems:  Review of Systems  Constitutional: Negative for chills, fever and malaise/fatigue.  HENT: Negative for congestion, ear pain and sore throat.   Eyes: Negative.   Respiratory: Negative for cough, shortness of breath and wheezing.   Cardiovascular: Negative for chest pain, palpitations and leg swelling.  Gastrointestinal: Negative for abdominal pain, blood in stool, constipation, diarrhea, heartburn and melena.  Genitourinary: Negative.   Skin: Negative.   Neurological: Negative for dizziness, sensory change, loss of consciousness and headaches.  Psychiatric/Behavioral: Negative for depression. The patient is not nervous/anxious and does not have insomnia.     Family history- Review and unchanged  Social history- Review and unchanged  Physical Exam: BP 122/70   Pulse 64   Temp 98.4 F (36.9 C) (Temporal)   Resp 16   Ht 6' 0.05" (1.83 m)   Wt 198 lb (89.8 kg)   BMI 26.82 kg/m  Wt Readings from Last 3 Encounters:  04/17/16 198 lb (89.8 kg)  01/23/16 198 lb 9.6 oz (90.1 kg)  10/02/15 194 lb 9.6 oz (88.3 kg)    General Appearance: Well nourished well developed, in no apparent distress. Eyes: PERRLA, EOMs, conjunctiva no swelling  or erythema ENT/Mouth: Ear canals normal without obstruction, swelling, erythma, discharge.  TMs normal bilaterally.  Oropharynx moist, clear, without exudate, or postoropharyngeal swelling. Neck: Supple, thyroid normal,no cervical adenopathy  Respiratory: Respiratory effort normal, Breath sounds clear A&P without rhonchi, wheeze, or rale.  No retractions, no accessory usage. Cardio: RRR with no MRGs. Brisk peripheral pulses without edema.  Abdomen: Soft, + BS,  Non tender, no guarding, rebound, hernias, masses. Musculoskeletal: Full ROM, 5/5 strength, Normal gait Skin: Warm, dry without rashes, lesions, ecchymosis.  Neuro: Awake and oriented X 3, Cranial nerves intact. Normal muscle tone, no cerebellar symptoms. Psych: Normal affect, Insight and Judgment appropriate.    Starlyn Skeans, PA-C 9:01 AM [facility]

## 2016-10-22 ENCOUNTER — Ambulatory Visit (INDEPENDENT_AMBULATORY_CARE_PROVIDER_SITE_OTHER): Payer: BLUE CROSS/BLUE SHIELD | Admitting: Internal Medicine

## 2016-10-22 ENCOUNTER — Encounter: Payer: Self-pay | Admitting: Internal Medicine

## 2016-10-22 VITALS — BP 148/82 | HR 58 | Temp 97.7°F | Resp 16 | Ht 72.25 in | Wt 192.0 lb

## 2016-10-22 DIAGNOSIS — R7303 Prediabetes: Secondary | ICD-10-CM

## 2016-10-22 DIAGNOSIS — I10 Essential (primary) hypertension: Secondary | ICD-10-CM

## 2016-10-22 DIAGNOSIS — Z79899 Other long term (current) drug therapy: Secondary | ICD-10-CM | POA: Diagnosis not present

## 2016-10-22 DIAGNOSIS — E559 Vitamin D deficiency, unspecified: Secondary | ICD-10-CM | POA: Diagnosis not present

## 2016-10-22 DIAGNOSIS — Z111 Encounter for screening for respiratory tuberculosis: Secondary | ICD-10-CM

## 2016-10-22 DIAGNOSIS — Z136 Encounter for screening for cardiovascular disorders: Secondary | ICD-10-CM

## 2016-10-22 DIAGNOSIS — Z0000 Encounter for general adult medical examination without abnormal findings: Secondary | ICD-10-CM

## 2016-10-22 DIAGNOSIS — Z1211 Encounter for screening for malignant neoplasm of colon: Secondary | ICD-10-CM

## 2016-10-22 DIAGNOSIS — R5383 Other fatigue: Secondary | ICD-10-CM

## 2016-10-22 DIAGNOSIS — Z125 Encounter for screening for malignant neoplasm of prostate: Secondary | ICD-10-CM | POA: Diagnosis not present

## 2016-10-22 DIAGNOSIS — Z0001 Encounter for general adult medical examination with abnormal findings: Secondary | ICD-10-CM

## 2016-10-22 DIAGNOSIS — E782 Mixed hyperlipidemia: Secondary | ICD-10-CM

## 2016-10-22 DIAGNOSIS — Z1212 Encounter for screening for malignant neoplasm of rectum: Secondary | ICD-10-CM

## 2016-10-22 LAB — HEPATIC FUNCTION PANEL
ALBUMIN: 4.3 g/dL (ref 3.6–5.1)
ALK PHOS: 47 U/L (ref 40–115)
ALT: 16 U/L (ref 9–46)
AST: 15 U/L (ref 10–35)
Bilirubin, Direct: 0.1 mg/dL (ref <=0.2)
Indirect Bilirubin: 0.5 mg/dL (ref 0.2–1.2)
TOTAL PROTEIN: 6.9 g/dL (ref 6.1–8.1)
Total Bilirubin: 0.6 mg/dL (ref 0.2–1.2)

## 2016-10-22 LAB — BASIC METABOLIC PANEL WITH GFR
BUN: 17 mg/dL (ref 7–25)
CALCIUM: 9.6 mg/dL (ref 8.6–10.3)
CO2: 23 mmol/L (ref 20–31)
Chloride: 104 mmol/L (ref 98–110)
Creat: 0.87 mg/dL (ref 0.70–1.25)
GFR, Est Non African American: >89 mL/min (ref >=60)
GLUCOSE: 105 mg/dL — AB (ref 65–99)
POTASSIUM: 4.2 mmol/L (ref 3.5–5.3)
Sodium: 139 mmol/L (ref 135–146)

## 2016-10-22 LAB — CBC WITH DIFFERENTIAL/PLATELET
Basophils Absolute: 0 cells/uL (ref 0–200)
Basophils Relative: 0 %
EOS ABS: 195 {cells}/uL (ref 15–500)
Eosinophils Relative: 3 %
HEMATOCRIT: 46.4 % (ref 38.5–50.0)
HEMOGLOBIN: 15.6 g/dL (ref 13.2–17.1)
LYMPHS PCT: 22 %
Lymphs Abs: 1430 cells/uL (ref 850–3900)
MCH: 29.7 pg (ref 27.0–33.0)
MCHC: 33.6 g/dL (ref 32.0–36.0)
MCV: 88.4 fL (ref 80.0–100.0)
MONO ABS: 520 {cells}/uL (ref 200–950)
MPV: 10.4 fL (ref 7.5–12.5)
Monocytes Relative: 8 %
NEUTROS PCT: 67 %
Neutro Abs: 4355 cells/uL (ref 1500–7800)
Platelets: 298 10*3/uL (ref 140–400)
RBC: 5.25 MIL/uL (ref 4.20–5.80)
RDW: 13.2 % (ref 11.0–15.0)
WBC: 6.5 10*3/uL (ref 3.8–10.8)

## 2016-10-22 LAB — IRON AND TIBC
%SAT: 16 % (ref 15–60)
IRON: 50 ug/dL (ref 50–180)
TIBC: 314 ug/dL (ref 250–425)
UIBC: 264 ug/dL

## 2016-10-22 LAB — LIPID PANEL
Cholesterol: 202 mg/dL — ABNORMAL HIGH (ref <200)
HDL: 41 mg/dL (ref >40)
LDL CALC: 134 mg/dL — AB (ref <100)
TRIGLYCERIDES: 136 mg/dL (ref <150)
Total CHOL/HDL Ratio: 4.9 Ratio (ref <5.0)
VLDL: 27 mg/dL (ref <30)

## 2016-10-22 LAB — TSH: TSH: 1.79 mIU/L (ref 0.40–4.50)

## 2016-10-22 NOTE — Patient Instructions (Signed)

## 2016-10-22 NOTE — Progress Notes (Signed)
[facility]   Unk Pinto, M.D.      Uvaldo Bristle. Silverio Lay, P.A.-[facility]                [address] [place], [place] [address] Telephone [phone] Telefax [phone] Annual  Screening/Preventative Visit  & Comprehensive Evaluation & Examination     This very nice 66 y.o. MWM presents for a Screening/Preventative Visit & comprehensive evaluation and management of multiple medical co-morbidities.  Patient has been followed for labile HTN, Prediabetes, Hyperlipidemia and Vitamin D Deficiency.     Patient has been followed expectantly for labile HTN predating since 2002. Patient's BP has been controlled at home.  Today's BP is elevated at 148/82. Patient denies any cardiac symptoms as chest pain, palpitations, shortness of breath, dizziness or ankle swelling.     Patient's hyperlipidemia is not controlled with diet and medications. Patient has hx/o intolerance to Pravastatin and has been on Zetia. Patient denies myalgias or other medication SE's. Last lipids were not at goal: Lab Results  Component Value Date   CHOL 192 04/17/2016   HDL 39 (L) 04/17/2016   LDLCALC 106 (H) 04/17/2016   TRIG 236 (H) 04/17/2016   CHOLHDL 4.9 04/17/2016      Patient has prediabetes (A1c 6.1% in 2012 and 6.0% in 2016)  and patient denies reactive hypoglycemic symptoms, visual blurring, diabetic polys or paresthesias. Last A1c was not at goal: Lab Results  Component Value Date   HGBA1C 6.0 (H) 10/02/2015       Finally, patient has history of Vitamin D Deficiency ("37" in 2008) and last vitamin D was at goal: Lab Results  Component Value Date   VD25OH 78 10/02/2015   Current Outpatient Prescriptions on File Prior to Visit  Medication Sig  . aspirin 81 MG tablet Take 81 mg by mouth daily.  . Cholecalciferol (VITAMIN D PO) Take 2,000 Units by mouth 2 (two) times daily.  . Cyanocobalamin (VITAMIN B 12 PO) Take 1  tablet by mouth 3 (three) times daily.  Marland Kitchen ezetimibe (ZETIA) 10 MG tablet Take 1 tablet (10 mg total) by mouth daily.  Marland Kitchen OVER THE COUNTER MEDICATION 1,000 mg daily. Tumeric   No current facility-administered medications on file prior to visit.    Allergies  Allergen Reactions  . Pravastatin   . Red Yeast Rice [Cholestin] Rash   Past Medical History:  Diagnosis Date  . Diabetes mellitus without complication ([place]) 10/6281   A1c 6.1% preDiabetes  . H/O vasectomy 44  . Hyperlipidemia 2005  . Hypertension 2009   labile-monitoring expectantly  . Hypogonadism male Oct 2014   Testosterone = 255 (Patient declined treatment)  . Vitamin D deficiency 2008   Vit D = 37   Health Maintenance  Topic Date Due  . COLONOSCOPY  09/24/2014  . INFLUENZA VACCINE  11/12/2016  . TETANUS/TDAP  08/01/2019  . Hepatitis C Screening  Completed  . HIV Screening  Completed   Immunization History  Administered Date(s) Administered  . DTaP 04/14/2009  . Influenza Split 01/12/2013  . PPD Test 09/14/2013, 09/19/2014, 10/02/2015  . Pneumococcal Polysaccharide-23 04/14/1997  . Td 04/14/1997  . Tdap 07/31/2009   Past Surgical History:  Procedure Laterality Date  . COLONOSCOPY N/A 2006  . INGUINAL HERNIA REPAIR Bilateral 2009  . INGUINAL HERNIA REPAIR Left 1997  . INGUINAL HERNIA REPAIR Left 1998  . KNEE  ARTHROSCOPY Left 2010  . VASECTOMY Bilateral 1987   Family History  Problem Relation Age of Onset  . Cancer Father   . Heart disease Father   . Diabetes Brother   . Hyperlipidemia Brother    Social History   Social History  . Marital status: Married    Spouse name: N/A  . Number of children: N/A  . Years of education: N/A   Occupational History  . Not on file.   Social History Main Topics  . Smoking status: Former Smoker    Years: 10.00    Quit date: 09/15/1991  . Smokeless tobacco: Never Used  . Alcohol use No  . Drug use: No  . Sexual activity: Yes    ROS Constitutional: Denies  fever, chills, weight loss/gain, headaches, insomnia,  night sweats or change in appetite. Does c/o fatigue. Eyes: Denies redness, blurred vision, diplopia, discharge, itchy or watery eyes.  ENT: Denies discharge, congestion, post nasal drip, epistaxis, sore throat, earache, hearing loss, dental pain, Tinnitus, Vertigo, Sinus pain or snoring.  Cardio: Denies chest pain, palpitations, irregular heartbeat, syncope, dyspnea, diaphoresis, orthopnea, PND, claudication or edema Respiratory: denies cough, dyspnea, DOE, pleurisy, hoarseness, laryngitis or wheezing.  Gastrointestinal: Denies dysphagia, heartburn, reflux, water brash, pain, cramps, nausea, vomiting, bloating, diarrhea, constipation, hematemesis, melena, hematochezia, jaundice or hemorrhoids Genitourinary: Denies dysuria, frequency, urgency, nocturia, hesitancy, discharge, hematuria or flank pain Musculoskeletal: Denies arthralgia, myalgia, stiffness, Jt. Swelling, pain, limp or strain/sprain. Denies Falls. Skin: Denies puritis, rash, hives, warts, acne, eczema or change in skin lesion Neuro: No weakness, tremor, incoordination, spasms, paresthesia or pain Psychiatric: Denies confusion, memory loss or sensory loss. Denies Depression. Endocrine: Denies change in weight, skin, hair change, nocturia, and paresthesia, diabetic polys, visual blurring or hyper / hypo glycemic episodes.  Heme/Lymph: No excessive bleeding, bruising or enlarged lymph nodes.  Physical Exam  BP (!) 148/82   Pulse (!) 58   Temp 97.7 F (36.5 C)   Resp 16   Ht 6' 0.25" (1.835 m)   Wt 192 lb (87.1 kg)   BMI 25.86 kg/m   General Appearance: Well nourished and well groomed and in no apparent distress.  Eyes: PERRLA, EOMs, conjunctiva no swelling or erythema, normal fundi and vessels. Sinuses: No frontal/maxillary tenderness ENT/Mouth: EACs patent / TMs  nl. Nares clear without erythema, swelling, mucoid exudates. Oral hygiene is good. No erythema, swelling, or  exudate. Tongue normal, non-obstructing. Tonsils not swollen or erythematous. Hearing normal.  Neck: Supple, thyroid normal. No bruits, nodes or JVD. Respiratory: Respiratory effort normal.  BS equal and clear bilateral without rales, rhonci, wheezing or stridor. Cardio: Heart sounds are normal with regular rate and rhythm and no murmurs, rubs or gallops. Peripheral pulses are normal and equal bilaterally without edema. No aortic or femoral bruits. Chest: symmetric with normal excursions and percussion.  Abdomen: Soft, with Nl bowel sounds. Nontender, no guarding, rebound, hernias, masses, or organomegaly.  Lymphatics: Non tender without lymphadenopathy.  Genitourinary: No hernias.Testes nl. DRE - prostate nl for age - smooth & firm w/o nodules. Musculoskeletal: Full ROM all peripheral extremities, joint stability, 5/5 strength, and normal gait. Skin: Warm and dry without rashes, lesions, cyanosis, clubbing or  ecchymosis.  Neuro: Cranial nerves intact, reflexes equal bilaterally. Normal muscle tone, no cerebellar symptoms. Sensation intact.  Pysch: Alert and oriented X 3 with normal affect, insight and judgment appropriate.   Assessment and Plan  1. Annual Preventative/Screening Exam    2. Essential hypertension  - Advised more frequent BP monitoring &  call if remains elevated  - EKG 12-Lead - [place], RETROPERITNL ABD,  LTD - Urinalysis, Routine w reflex microscopic - Microalbumin / creatinine urine ratio - CBC with Differential/Platelet - BASIC METABOLIC PANEL WITH GFR - Magnesium - TSH  3. Hyperlipidemia, mixed  - EKG 12-Lead - [place], RETROPERITNL ABD,  LTD - Hepatic function panel - Lipid panel - TSH  4. Prediabetes  - EKG 12-Lead - [place], RETROPERITNL ABD,  LTD - Hemoglobin A1c - Insulin, random  5. Vitamin D deficiency  - VITAMIN D 25 Hydroxy   6. Screening for rectal cancer  - POC Hemoccult Bld/Stl  7. Prostate cancer screening  - PSA  8. Screening for ischemic  heart disease  - EKG 12-Lead  9. Screening for AAA (aortic abdominal aneurysm)  - [place], RETROPERITNL ABD,  LTD  10. Screening examination for pulmonary tuberculosis   11. Fatigue, unspecified type  - Vitamin B12 - Iron and TIBC - Testosterone - CBC with Differential/Platelet - TSH  12. Medication management  - Urinalysis, Routine w reflex microscopic - Microalbumin / creatinine urine ratio - CBC with Differential/Platelet - BASIC METABOLIC PANEL WITH GFR - Hepatic function panel - Magnesium - Lipid panel - TSH - Hemoglobin A1c - Insulin, random - VITAMIN D 25 Hydroxy        Patient was counseled in prudent diet, weight control to achieve/maintain BMI less than 25, BP monitoring, regular exercise and medications as discussed.  Discussed med effects and SE's. Routine screening labs and tests as requested with regular follow-up as recommended. Over 40 minutes of exam, counseling, chart review and high complex critical decision making was performed

## 2016-10-23 LAB — URINALYSIS, ROUTINE W REFLEX MICROSCOPIC
BILIRUBIN URINE: NEGATIVE
Glucose, UA: NEGATIVE
HGB URINE DIPSTICK: NEGATIVE
KETONES UR: NEGATIVE
Leukocytes, UA: NEGATIVE
NITRITE: NEGATIVE
PROTEIN: NEGATIVE
SPECIFIC GRAVITY, URINE: 1.027 (ref 1.001–1.035)
pH: 6 (ref 5.0–8.0)

## 2016-10-23 LAB — TESTOSTERONE: Testosterone: 415 ng/dL (ref 250–827)

## 2016-10-23 LAB — URINALYSIS, MICROSCOPIC ONLY
Bacteria, UA: NONE SEEN [HPF]
CASTS: NONE SEEN [LPF]
CRYSTALS: NONE SEEN [HPF]
Squamous Epithelial / LPF: NONE SEEN [HPF] (ref <=5)
WBC, UA: NONE SEEN WBC/HPF (ref <=5)
YEAST: NONE SEEN [HPF]

## 2016-10-23 LAB — HEMOGLOBIN A1C
HEMOGLOBIN A1C: 5.7 % — AB (ref <5.7)
MEAN PLASMA GLUCOSE: 117 mg/dL

## 2016-10-23 LAB — MICROALBUMIN / CREATININE URINE RATIO
Creatinine, Urine: 270 mg/dL (ref 20–370)
Microalb Creat Ratio: 5 mcg/mg creat (ref <30)
Microalb, Ur: 1.3 mg/dL

## 2016-10-23 LAB — PSA: PSA: 2.4 ng/mL (ref <=4.0)

## 2016-10-23 LAB — MAGNESIUM: Magnesium: 2 mg/dL (ref 1.5–2.5)

## 2016-10-23 LAB — INSULIN, RANDOM: INSULIN: 5.3 u[IU]/mL (ref 2.0–19.6)

## 2016-10-23 LAB — VITAMIN B12: VITAMIN B 12: 531 pg/mL (ref 200–1100)

## 2016-10-23 LAB — VITAMIN D 25 HYDROXY (VIT D DEFICIENCY, FRACTURES): Vit D, 25-Hydroxy: 78 ng/mL (ref 30–100)

## 2016-12-10 ENCOUNTER — Other Ambulatory Visit: Payer: Self-pay | Admitting: *Deleted

## 2016-12-10 DIAGNOSIS — Z1212 Encounter for screening for malignant neoplasm of rectum: Secondary | ICD-10-CM

## 2016-12-10 LAB — POC HEMOCCULT BLD/STL (HOME/3-CARD/SCREEN)
FECAL OCCULT BLD: NEGATIVE
FECAL OCCULT BLD: NEGATIVE
Fecal Occult Blood, POC: NEGATIVE

## 2017-02-04 ENCOUNTER — Other Ambulatory Visit: Payer: Self-pay

## 2017-02-04 MED ORDER — EZETIMIBE 10 MG PO TABS: 10.0000 mg | ORAL_TABLET | Freq: Every day | ORAL | 1 refills | Status: DC

## 2017-02-10 ENCOUNTER — Ambulatory Visit: Payer: Self-pay | Admitting: Physician Assistant

## 2017-05-14 ENCOUNTER — Other Ambulatory Visit: Payer: Self-pay | Admitting: *Deleted

## 2017-05-14 ENCOUNTER — Ambulatory Visit: Payer: BLUE CROSS/BLUE SHIELD | Admitting: Internal Medicine

## 2017-05-14 ENCOUNTER — Encounter: Payer: Self-pay | Admitting: Internal Medicine

## 2017-05-14 VITALS — BP 128/82 | HR 56 | Temp 97.1°F | Resp 16 | Ht 72.25 in | Wt 195.2 lb

## 2017-05-14 DIAGNOSIS — E559 Vitamin D deficiency, unspecified: Secondary | ICD-10-CM | POA: Diagnosis not present

## 2017-05-14 DIAGNOSIS — R7303 Prediabetes: Secondary | ICD-10-CM

## 2017-05-14 DIAGNOSIS — R0989 Other specified symptoms and signs involving the circulatory and respiratory systems: Secondary | ICD-10-CM | POA: Diagnosis not present

## 2017-05-14 DIAGNOSIS — E782 Mixed hyperlipidemia: Secondary | ICD-10-CM | POA: Diagnosis not present

## 2017-05-14 DIAGNOSIS — Z79899 Other long term (current) drug therapy: Secondary | ICD-10-CM | POA: Diagnosis not present

## 2017-05-14 DIAGNOSIS — Z1159 Encounter for screening for other viral diseases: Secondary | ICD-10-CM

## 2017-05-14 MED ORDER — EZETIMIBE 10 MG PO TABS: 10.0000 mg | ORAL_TABLET | Freq: Every day | ORAL | 1 refills | Status: DC

## 2017-05-14 NOTE — Progress Notes (Signed)
This very nice 67 y.o. MWM presents for 6 month follow up with Labile Hypertension, Hyperlipidemia, Pre-Diabetes and Vitamin D Deficiency.      Patient is followed expectantly with for HTN circa 2002 & BP has been controlled at home. Today's BP is at goal - 128/82. Patient has had no complaints of any cardiac type chest pain, palpitations, dyspnea / orthopnea / PND, dizziness, claudication, or dependent edema. BP Readings from Last 3 Encounters:  05/14/17 128/82  10/22/16 (!) 148/82  04/17/16 122/70       Hyperlipidemia is not controlled with diet & Zetia alone (Intolerant to Pravastatin). Patient denies myalgias or other med SE's. Last Lipids were not at goal: Lab Results  Component Value Date   CHOL 202 (H) 10/22/2016   HDL 41 10/22/2016   LDLCALC 134 (H) 10/22/2016   TRIG 136 10/22/2016   CHOLHDL 4.9 10/22/2016      Also, the patient has history of PreDiabetes (A1c 6.1%/2012 and 6.0%/2016) and has had no symptoms of reactive hypoglycemia, diabetic polys, paresthesias or visual blurring.  Last A1c was almost to goal: Lab Results  Component Value Date   HGBA1C 5.7 (H) 10/22/2016      Further, the patient also has history of Vitamin D Deficiency ("37"/2008)  and supplements vitamin D without any suspected side-effects. Last vitamin D was   Lab Results  Component Value Date   VD25OH 78 10/22/2016   Current Outpatient Medications on File Prior to Visit  Medication Sig  . aspirin 81 MG tablet Take 81 mg by mouth daily.  Marland Kitchen VITAMIN D  Take 2,000 Units by mouth 2 (two) times daily.  . Cinnamon 500 MG TABS Take by mouth 2 (two) times daily.  Marland Kitchen ezetimibe (ZETIA) 10 MG  Take 1 tablet (10 mg total) by mouth daily.  . Tumeric 1,000 mg daily.    Allergies  Allergen Reactions  . Pravastatin   . Red Yeast Rice [Cholestin] Rash   PMHx:   Past Medical History:  Diagnosis Date  . Diabetes mellitus without complication ([place]) 11/6576   A1c 6.1% preDiabetes  . H/O vasectomy 10  .  Hyperlipidemia 2005  . Hypertension 2009   labile-monitoring expectantly  . Hypogonadism male Oct 2014   Testosterone = 255 (Patient declined treatment)  . Vitamin D deficiency 2008   Vit D = 37   Immunization History  Administered Date(s) Administered  . DTaP 04/14/2009  . Influenza Split 01/12/2013  . PPD Test 09/14/2013, 09/19/2014, 10/02/2015  . Pneumococcal Polysaccharide-23 04/14/1997  . Td 04/14/1997  . Tdap 07/31/2009   Past Surgical History:  Procedure Laterality Date  . COLONOSCOPY N/A 2006  . INGUINAL HERNIA REPAIR Bilateral 2009  . INGUINAL HERNIA REPAIR Left 1997  . INGUINAL HERNIA REPAIR Left 1998  . KNEE ARTHROSCOPY Left 2010  . VASECTOMY Bilateral 1987   FHx:    Reviewed / unchanged  SHx:    Reviewed / unchanged  Systems Review:  Constitutional: Denies fever, chills, wt changes, headaches, insomnia, fatigue, night sweats, change in appetite. Eyes: Denies redness, blurred vision, diplopia, discharge, itchy, watery eyes.  ENT: Denies discharge, congestion, post nasal drip, epistaxis, sore throat, earache, hearing loss, dental pain, tinnitus, vertigo, sinus pain, snoring.  CV: Denies chest pain, palpitations, irregular heartbeat, syncope, dyspnea, diaphoresis, orthopnea, PND, claudication or edema. Respiratory: denies cough, dyspnea, DOE, pleurisy, hoarseness, laryngitis, wheezing.  Gastrointestinal: Denies dysphagia, odynophagia, heartburn, reflux, water brash, abdominal pain or cramps, nausea, vomiting, bloating, diarrhea, constipation, hematemesis, melena,  hematochezia  or hemorrhoids. Genitourinary: Denies dysuria, frequency, urgency, nocturia, hesitancy, discharge, hematuria or flank pain. Musculoskeletal: Denies arthralgias, myalgias, stiffness, jt. swelling, pain, limping or strain/sprain.  Skin: Denies pruritus, rash, hives, warts, acne, eczema or change in skin lesion(s). Neuro: No weakness, tremor, incoordination, spasms, paresthesia or  pain. Psychiatric: Denies confusion, memory loss or sensory loss. Endo: Denies change in weight, skin or hair change.  Heme/Lymph: No excessive bleeding, bruising or enlarged lymph nodes.  Physical Exam  BP 128/82   Pulse (!) 56   Temp (!) 97.1 F (36.2 C)   Resp 16   Ht 6' 0.25" (1.835 m)   Wt 195 lb 3.2 oz (88.5 kg)   BMI 26.29 kg/m   Appears well nourished, well groomed  and in no distress.  Eyes: PERRLA, EOMs, conjunctiva no swelling or erythema. Sinuses: No frontal/maxillary tenderness ENT/Mouth: EAC's clear, TM's nl w/o erythema, bulging. Nares clear w/o erythema, swelling, exudates. Oropharynx clear without erythema or exudates. Oral hygiene is good. Tongue normal, non obstructing. Hearing intact.  Neck: Supple. Thyroid nl. Car 2+/2+ without bruits, nodes or JVD. Chest: Respirations nl with BS clear & equal w/o rales, rhonchi, wheezing or stridor.  Cor: Heart sounds normal w/ regular rate and rhythm without sig. murmurs, gallops, clicks or rubs. Peripheral pulses normal and equal  without edema.  Abdomen: Soft & bowel sounds normal. Non-tender w/o guarding, rebound, hernias, masses or organomegaly.  Lymphatics: Unremarkable.  Musculoskeletal: Full ROM all peripheral extremities, joint stability, 5/5 strength and normal gait.  Skin: Warm, dry without exposed rashes, lesions or ecchymosis apparent.  Neuro: Cranial nerves intact, reflexes equal bilaterally. Sensory-motor testing grossly intact. Tendon reflexes grossly intact.  Pysch: Alert & oriented x 3.  Insight and judgement nl & appropriate. No ideations.  Assessment and Plan:  1. Labile hypertension  - Continue medication, monitor blood pressure at home.  - Continue DASH diet. Reminder to go to the ER if any CP,  SOB, nausea, dizziness, severe HA, changes vision/speech.  - BASIC METABOLIC PANEL WITH GFR - Hepatic function panel - Lipid panel - Hemoglobin A1c  2. Hyperlipidemia, mixed  - Continue diet/meds,  exercise,& lifestyle modifications.  - Continue monitor periodic cholesterol/liver & renal functions  - Magnesium - TSH  3. Prediabetes  - Continue diet, exercise, lifestyle modifications.  - Monitor appropriate labs.  - Hemoglobin A1c - Insulin, random  4. Vitamin D deficiency  - Continue supplementation.  - VITAMIN D 25 Hydroxy   5. Medication management  - BASIC METABOLIC PANEL WITH GFR - Hepatic function panel - Magnesium - Lipid panel - TSH - Hemoglobin A1c - Insulin, random - VITAMIN D 25 Hydroxy       Discussed  regular exercise, BP monitoring, weight control to achieve/maintain BMI less than 25 and discussed med and SE's. Recommended labs to assess and monitor clinical status with further disposition pending results of labs. Over 30 minutes of exam, counseling, chart review was performed.

## 2017-05-14 NOTE — Patient Instructions (Signed)

## 2017-05-15 LAB — VITAMIN D 25 HYDROXY (VIT D DEFICIENCY, FRACTURES): VIT D 25 HYDROXY: 45 ng/mL (ref 30–100)

## 2017-05-15 LAB — LIPID PANEL
CHOLESTEROL: 212 mg/dL — AB (ref <200)
HDL: 43 mg/dL (ref >40)
LDL Cholesterol (Calc): 124 mg/dL (calc) — ABNORMAL HIGH
Non-HDL Cholesterol (Calc): 169 mg/dL (calc) — ABNORMAL HIGH (ref <130)
TRIGLYCERIDES: 315 mg/dL — AB (ref <150)
Total CHOL/HDL Ratio: 4.9 (calc) (ref <5.0)

## 2017-05-15 LAB — BASIC METABOLIC PANEL WITH GFR
BUN: 15 mg/dL (ref 7–25)
CALCIUM: 9.5 mg/dL (ref 8.6–10.3)
CHLORIDE: 102 mmol/L (ref 98–110)
CO2: 29 mmol/L (ref 20–32)
Creat: 0.94 mg/dL (ref 0.70–1.25)
GFR, Est African American: 99 mL/min/{1.73_m2} (ref >=60)
GFR, Est Non African American: 85 mL/min/{1.73_m2} (ref >=60)
Glucose, Bld: 102 mg/dL — ABNORMAL HIGH (ref 65–99)
POTASSIUM: 4.5 mmol/L (ref 3.5–5.3)
Sodium: 140 mmol/L (ref 135–146)

## 2017-05-15 LAB — HEPATIC FUNCTION PANEL
AG Ratio: 1.6 (calc) (ref 1.0–2.5)
ALBUMIN MSPROF: 4.2 g/dL (ref 3.6–5.1)
ALT: 27 U/L (ref 9–46)
AST: 18 U/L (ref 10–35)
Alkaline phosphatase (APISO): 54 U/L (ref 40–115)
Bilirubin, Direct: 0.1 mg/dL (ref 0.0–0.2)
Globulin: 2.6 g/dL (calc) (ref 1.9–3.7)
Indirect Bilirubin: 0.6 mg/dL (calc) (ref 0.2–1.2)
Total Bilirubin: 0.7 mg/dL (ref 0.2–1.2)
Total Protein: 6.8 g/dL (ref 6.1–8.1)

## 2017-05-15 LAB — HEMOGLOBIN A1C
HEMOGLOBIN A1C: 5.9 %{Hb} — AB (ref <5.7)
Mean Plasma Glucose: 123 (calc)
eAG (mmol/L): 6.8 (calc)

## 2017-05-15 LAB — TSH: TSH: 2.41 m[IU]/L (ref 0.40–4.50)

## 2017-05-15 LAB — MAGNESIUM: MAGNESIUM: 2 mg/dL (ref 1.5–2.5)

## 2017-05-15 LAB — INSULIN, RANDOM: INSULIN: 6.2 u[IU]/mL (ref 2.0–19.6)

## 2017-05-16 ENCOUNTER — Encounter: Payer: Self-pay | Admitting: Internal Medicine

## 2017-11-04 ENCOUNTER — Ambulatory Visit: Payer: BLUE CROSS/BLUE SHIELD | Admitting: Internal Medicine

## 2017-11-04 ENCOUNTER — Encounter: Payer: Self-pay | Admitting: Internal Medicine

## 2017-11-04 VITALS — BP 150/90 | HR 60 | Temp 97.9°F | Ht 73.0 in | Wt 196.6 lb

## 2017-11-04 DIAGNOSIS — E559 Vitamin D deficiency, unspecified: Secondary | ICD-10-CM | POA: Diagnosis not present

## 2017-11-04 DIAGNOSIS — R0989 Other specified symptoms and signs involving the circulatory and respiratory systems: Secondary | ICD-10-CM

## 2017-11-04 DIAGNOSIS — Z13220 Encounter for screening for lipoid disorders: Secondary | ICD-10-CM

## 2017-11-04 DIAGNOSIS — Z1212 Encounter for screening for malignant neoplasm of rectum: Secondary | ICD-10-CM

## 2017-11-04 DIAGNOSIS — Z131 Encounter for screening for diabetes mellitus: Secondary | ICD-10-CM | POA: Diagnosis not present

## 2017-11-04 DIAGNOSIS — Z1389 Encounter for screening for other disorder: Secondary | ICD-10-CM

## 2017-11-04 DIAGNOSIS — Z125 Encounter for screening for malignant neoplasm of prostate: Secondary | ICD-10-CM | POA: Diagnosis not present

## 2017-11-04 DIAGNOSIS — R350 Frequency of micturition: Secondary | ICD-10-CM

## 2017-11-04 DIAGNOSIS — Z0000 Encounter for general adult medical examination without abnormal findings: Secondary | ICD-10-CM | POA: Diagnosis not present

## 2017-11-04 DIAGNOSIS — Z1211 Encounter for screening for malignant neoplasm of colon: Secondary | ICD-10-CM

## 2017-11-04 DIAGNOSIS — R7303 Prediabetes: Secondary | ICD-10-CM

## 2017-11-04 DIAGNOSIS — R5383 Other fatigue: Secondary | ICD-10-CM

## 2017-11-04 DIAGNOSIS — N401 Enlarged prostate with lower urinary tract symptoms: Secondary | ICD-10-CM

## 2017-11-04 DIAGNOSIS — I10 Essential (primary) hypertension: Secondary | ICD-10-CM

## 2017-11-04 DIAGNOSIS — Z87891 Personal history of nicotine dependence: Secondary | ICD-10-CM

## 2017-11-04 DIAGNOSIS — Z136 Encounter for screening for cardiovascular disorders: Secondary | ICD-10-CM | POA: Diagnosis not present

## 2017-11-04 DIAGNOSIS — Z111 Encounter for screening for respiratory tuberculosis: Secondary | ICD-10-CM

## 2017-11-04 DIAGNOSIS — Z0001 Encounter for general adult medical examination with abnormal findings: Secondary | ICD-10-CM

## 2017-11-04 DIAGNOSIS — E782 Mixed hyperlipidemia: Secondary | ICD-10-CM

## 2017-11-04 DIAGNOSIS — Z79899 Other long term (current) drug therapy: Secondary | ICD-10-CM

## 2017-11-04 DIAGNOSIS — Z8249 Family history of ischemic heart disease and other diseases of the circulatory system: Secondary | ICD-10-CM

## 2017-11-04 NOTE — Progress Notes (Signed)
[facility]   Unk Pinto, M.D.     Uvaldo Bristle. Silverio Lay, P.A.-C Liane Comber, [place]                [address] [place], [place] [address] Telephone [phone] Telefax [phone] Annual  Screening/Preventative Visit  & Comprehensive Evaluation & Examination     This very nice 67 y.o. MWM presents for a Screening /Preventative Visit & comprehensive evaluation and management of multiple medical co-morbidities.  Patient has been followed for HTN, HLD, Prediabetes and Vitamin D Deficiency.     Patient has been followed since 2-002 for labile HTN. Patient's BP has been controlled at home.  Today's BP is elevated at 150/90 & confirmed. Patient denies any cardiac symptoms as chest pain, palpitations, shortness of breath, dizziness or ankle swelling.     Patient's hyperlipidemia is not controlled with diet and he has hx/o Statin Intolerance and I on Zetia. Patient denies myalgias or other medication SE's. Last lipids were not at goal: Lab Results  Component Value Date   CHOL 212 (H) 05/14/2017   HDL 43 05/14/2017   LDLCALC 124 (H) 05/14/2017   TRIG 315 (H) 05/14/2017   CHOLHDL 4.9 05/14/2017      Patient has prediabetes (A1c 6.1%/2012 and 6.0%/2016)  and patient denies reactive hypoglycemic symptoms, visual blurring, diabetic polys or paresthesias. Last A1c was not at goal:  Lab Results  Component Value Date   HGBA1C 5.9 (H) 05/14/2017       Finally, patient has history of Vitamin D Deficiency ("37" in 2008) and last vitamin D was still low (goal 70-100): Lab Results  Component Value Date   VD25OH 45 05/14/2017   Current Outpatient Medications on File Prior to Visit  Medication Sig  . aspirin 81 MG tablet Take 81 mg by mouth daily.  . Cholecalciferol (VITAMIN D PO) Take 2,000 Units by mouth 2 (two) times daily.  . Cinnamon 500 MG TABS Take by mouth 2 (two) times daily.  Marland Kitchen ezetimibe  (ZETIA) 10 MG tablet Take 1 tablet (10 mg total) by mouth daily.  Marland Kitchen OVER THE COUNTER MEDICATION 1,000 mg daily. Tumeric   No current facility-administered medications on file prior to visit.    Allergies  Allergen Reactions  . Pravastatin   . Red Yeast Rice [Cholestin] Rash   Past Medical History:  Diagnosis Date  . Diabetes mellitus without complication ([place]) 06/4740   A1c 6.1% preDiabetes  . H/O vasectomy 67  . Hyperlipidemia 2005  . Hypertension 2009   labile-monitoring expectantly  . Hypogonadism male Oct 2014   Testosterone = 255 (Patient declined treatment)  . Vitamin D deficiency 2008   Vit D = 37   Health Maintenance  Topic Date Due  . COLONOSCOPY  09/24/2014  . INFLUENZA VACCINE  11/12/2017  . TETANUS/TDAP  08/01/2019  . Hepatitis C Screening  Completed  . HIV Screening  Completed   Immunization History  Administered Date(s) Administered  . DTaP 04/14/2009  . Influenza Split 01/12/2013  . PPD Test 09/14/2013, 09/19/2014, 10/02/2015, 11/04/2017  . Pneumococcal Polysaccharide-23 04/14/1997  . Td 04/14/1997  . Tdap 07/31/2009   Last Colon - 09/23/2004 - Dr Sharlett Iles - recc 10 year f/u & patient is aware he's overdue.   Past Surgical History:  Procedure Laterality Date  . COLONOSCOPY N/A 2006  . INGUINAL HERNIA REPAIR Bilateral 2009  .  INGUINAL HERNIA REPAIR Left 1997  . INGUINAL HERNIA REPAIR Left 1998  . KNEE ARTHROSCOPY Left 2010  . VASECTOMY Bilateral 1987   Family History  Problem Relation Age of Onset  . Cancer Father   . Heart disease Father   . Diabetes Brother   . Hyperlipidemia Brother    Social History   Socioeconomic History  . Marital status: Married x 39 years     Spouse name: Katrina  . Number of children: 2 sons   Occupational History  . Retired Economist  Tobacco Use  . Smoking status: Former Smoker    Years: 10.00    Last attempt to quit: 09/15/1991    Years since quitting: 26.1  . Smokeless tobacco: Never Used   Substance and Sexual Activity  . Alcohol use: No  . Drug use: No  . Sexual activity: Yes    ROS Constitutional: Denies fever, chills, weight loss/gain, headaches, insomnia,  night sweats or change in appetite. Does c/o fatigue. Eyes: Denies redness, blurred vision, diplopia, discharge, itchy or watery eyes.  ENT: Denies discharge, congestion, post nasal drip, epistaxis, sore throat, earache, hearing loss, dental pain, Tinnitus, Vertigo, Sinus pain or snoring.  Cardio: Denies chest pain, palpitations, irregular heartbeat, syncope, dyspnea, diaphoresis, orthopnea, PND, claudication or edema Respiratory: denies cough, dyspnea, DOE, pleurisy, hoarseness, laryngitis or wheezing.  Gastrointestinal: Denies dysphagia, heartburn, reflux, water brash, pain, cramps, nausea, vomiting, bloating, diarrhea, constipation, hematemesis, melena, hematochezia, jaundice or hemorrhoids Genitourinary: Denies dysuria, frequency, urgency, nocturia, hesitancy, discharge, hematuria or flank pain Musculoskeletal: Denies arthralgia, myalgia, stiffness, Jt. Swelling, pain, limp or strain/sprain. Denies Falls. Skin: Denies puritis, rash, hives, warts, acne, eczema or change in skin lesion Neuro: No weakness, tremor, incoordination, spasms, paresthesia or pain Psychiatric: Denies confusion, memory loss or sensory loss. Denies Depression. Endocrine: Denies change in weight, skin, hair change, nocturia, and paresthesia, diabetic polys, visual blurring or hyper / hypo glycemic episodes.  Heme/Lymph: No excessive bleeding, bruising or enlarged lymph nodes.  Physical Exam  BP (!) 150/90   Pulse 60   Temp 97.9 F (36.6 C)   Ht 6\' 1"  (1.854 m)   Wt 196 lb 9.6 oz (89.2 kg)   SpO2 99%   BMI 25.94 kg/m   General Appearance: Well nourished and well groomed and in no apparent distress.  Eyes: PERRLA, EOMs, conjunctiva no swelling or erythema, normal fundi and vessels. Sinuses: No frontal/maxillary tenderness ENT/Mouth:  EACs patent / TMs  nl. Nares clear without erythema, swelling, mucoid exudates. Oral hygiene is good. No erythema, swelling, or exudate. Tongue normal, non-obstructing. Tonsils not swollen or erythematous. Hearing normal.  Neck: Supple, thyroid not palpable. No bruits, nodes or JVD. Respiratory: Respiratory effort normal.  BS equal and clear bilateral without rales, rhonci, wheezing or stridor. Cardio: Heart sounds are normal with regular rate and rhythm and no murmurs, rubs or gallops. Peripheral pulses are normal and equal bilaterally without edema. No aortic or femoral bruits. Chest: symmetric with normal excursions and percussion.  Abdomen: Soft, with Nl bowel sounds. Nontender, no guarding, rebound, hernias, masses, or organomegaly.  Lymphatics: Non tender without lymphadenopathy.  Genitourinary: No hernias.Testes nl. DRE - prostate nl for age - smooth & firm w/o nodules. Musculoskeletal: Full ROM all peripheral extremities, joint stability, 5/5 strength, and normal gait. Skin: Warm and dry without rashes, lesions, cyanosis, clubbing or  ecchymosis.  Neuro: Cranial nerves intact, reflexes equal bilaterally. Normal muscle tone, no cerebellar symptoms. Sensation intact.  Pysch: Alert and oriented X 3 with normal affect,  insight and judgment appropriate.   Assessment and Plan  1. Annual Preventative/Screening Exam   - recc bid monitoring of BP's & call if elevated over 140/90  2. Labile hypertension  - EKG 12-Lead - [place], RETROPERITNL ABD,  LTD - Urinalysis, Routine w reflex microscopic - Microalbumin / creatinine urine ratio - CBC with Differential/Platelet - COMPLETE METABOLIC PANEL WITH GFR - Magnesium - TSH  3. Hyperlipidemia, mixed  - EKG 12-Lead - [place], RETROPERITNL ABD,  LTD - Lipid panel - TSH  4. Prediabetes  - EKG 12-Lead - [place], RETROPERITNL ABD,  LTD - Hemoglobin A1c - Insulin, random  5. Vitamin D deficiency  - VITAMIN D 25 Hydroxyl  6. Screening for  colorectal cancer  - POC Hemoccult Bld/Stl  7. Prostate cancer screening  - PSA  8. Screening for ischemic heart disease  - EKG 12-Lead  9. FHx: heart disease  - EKG 12-Lead - [place], RETROPERITNL ABD,  LTD  10. Former smoker  - EKG 12-Lead - [place], RETROPERITNL ABD,  LTD  11. Screening for AAA (aortic abdominal aneurysm)  - [place], RETROPERITNL ABD,  LTD  12. Screening examination for pulmonary tuberculosis  - TB Skin Test  13. Fatigue, unspecified type  - Iron,Total/Total Iron Binding Cap - Vitamin B12 - Testosterone - CBC with Differential/Platelet  14. Medication management  - Urinalysis, Routine w reflex microscopic - Microalbumin / creatinine urine ratio - CBC with Differential/Platelet - COMPLETE METABOLIC PANEL WITH GFR - Magnesium - Lipid panel - TSH - Hemoglobin A1c - Insulin, random - VITAMIN D 25 Hydroxyl        Patient was counseled in prudent diet, weight control to achieve/maintain BMI less than 25, BP monitoring, regular exercise and medications as discussed.  Discussed med effects and SE's. Routine screening labs and tests as requested with regular follow-up as recommended. Over 40 minutes of exam, counseling, chart review and high complex critical decision making was performed

## 2017-11-04 NOTE — Patient Instructions (Signed)

## 2017-11-05 ENCOUNTER — Encounter: Payer: Self-pay | Admitting: Internal Medicine

## 2017-11-05 LAB — CBC WITH DIFFERENTIAL/PLATELET
BASOS ABS: 52 {cells}/uL (ref 0–200)
Basophils Relative: 0.9 %
EOS PCT: 3.3 %
Eosinophils Absolute: 191 cells/uL (ref 15–500)
HCT: 45.9 % (ref 38.5–50.0)
HEMOGLOBIN: 15.6 g/dL (ref 13.2–17.1)
LYMPHS ABS: 1183 {cells}/uL (ref 850–3900)
MCH: 29.1 pg (ref 27.0–33.0)
MCHC: 34 g/dL (ref 32.0–36.0)
MCV: 85.5 fL (ref 80.0–100.0)
MPV: 10.7 fL (ref 7.5–12.5)
Monocytes Relative: 7.6 %
NEUTROS ABS: 3932 {cells}/uL (ref 1500–7800)
Neutrophils Relative %: 67.8 %
Platelets: 289 10*3/uL (ref 140–400)
RBC: 5.37 10*6/uL (ref 4.20–5.80)
RDW: 12.3 % (ref 11.0–15.0)
Total Lymphocyte: 20.4 %
WBC mixed population: 441 cells/uL (ref 200–950)
WBC: 5.8 10*3/uL (ref 3.8–10.8)

## 2017-11-05 LAB — MAGNESIUM: Magnesium: 2.1 mg/dL (ref 1.5–2.5)

## 2017-11-05 LAB — COMPLETE METABOLIC PANEL WITH GFR
AG Ratio: 1.9 (calc) (ref 1.0–2.5)
ALBUMIN MSPROF: 4.5 g/dL (ref 3.6–5.1)
ALT: 19 U/L (ref 9–46)
AST: 17 U/L (ref 10–35)
Alkaline phosphatase (APISO): 51 U/L (ref 40–115)
BUN: 17 mg/dL (ref 7–25)
CALCIUM: 9.7 mg/dL (ref 8.6–10.3)
CO2: 31 mmol/L (ref 20–32)
CREATININE: 0.88 mg/dL (ref 0.70–1.25)
Chloride: 103 mmol/L (ref 98–110)
GFR, EST AFRICAN AMERICAN: 105 mL/min/{1.73_m2} (ref >=60)
GFR, Est Non African American: 91 mL/min/{1.73_m2} (ref >=60)
GLOBULIN: 2.4 g/dL (ref 1.9–3.7)
GLUCOSE: 103 mg/dL — AB (ref 65–99)
Potassium: 4.3 mmol/L (ref 3.5–5.3)
SODIUM: 137 mmol/L (ref 135–146)
Total Bilirubin: 0.9 mg/dL (ref 0.2–1.2)
Total Protein: 6.9 g/dL (ref 6.1–8.1)

## 2017-11-05 LAB — IRON, TOTAL/TOTAL IRON BINDING CAP
%SAT: 45 % (ref 20–48)
Iron: 147 ug/dL (ref 50–180)
TIBC: 328 ug/dL (ref 250–425)

## 2017-11-05 LAB — INSULIN, RANDOM: Insulin: 3.9 u[IU]/mL (ref 2.0–19.6)

## 2017-11-05 LAB — URINALYSIS, ROUTINE W REFLEX MICROSCOPIC
Bilirubin Urine: NEGATIVE
Glucose, UA: NEGATIVE
HGB URINE DIPSTICK: NEGATIVE
KETONES UR: NEGATIVE
LEUKOCYTES UA: NEGATIVE
Nitrite: NEGATIVE
PROTEIN: NEGATIVE
Specific Gravity, Urine: 1.018 (ref 1.001–1.03)
pH: 5.5 (ref 5.0–8.0)

## 2017-11-05 LAB — LIPID PANEL
Cholesterol: 204 mg/dL — ABNORMAL HIGH (ref <200)
HDL: 43 mg/dL (ref >40)
LDL CHOLESTEROL (CALC): 122 mg/dL — AB
Non-HDL Cholesterol (Calc): 161 mg/dL (calc) — ABNORMAL HIGH (ref <130)
TRIGLYCERIDES: 251 mg/dL — AB (ref <150)
Total CHOL/HDL Ratio: 4.7 (calc) (ref <5.0)

## 2017-11-05 LAB — HEMOGLOBIN A1C
Hgb A1c MFr Bld: 6 % of total Hgb — ABNORMAL HIGH (ref <5.7)
Mean Plasma Glucose: 126 (calc)
eAG (mmol/L): 7 (calc)

## 2017-11-05 LAB — MICROALBUMIN / CREATININE URINE RATIO
Creatinine, Urine: 108 mg/dL (ref 20–320)
MICROALB/CREAT RATIO: 4 ug/mg{creat} (ref <30)
Microalb, Ur: 0.4 mg/dL

## 2017-11-05 LAB — VITAMIN D 25 HYDROXY (VIT D DEFICIENCY, FRACTURES): Vit D, 25-Hydroxy: 62 ng/mL (ref 30–100)

## 2017-11-05 LAB — VITAMIN B12: VITAMIN B 12: 254 pg/mL (ref 200–1100)

## 2017-11-05 LAB — PSA: PSA: 3.2 ng/mL (ref <=4.0)

## 2017-11-05 LAB — TESTOSTERONE: TESTOSTERONE: 402 ng/dL (ref 250–827)

## 2017-11-05 LAB — TSH: TSH: 2.29 m[IU]/L (ref 0.40–4.50)

## 2017-11-08 ENCOUNTER — Encounter: Payer: Self-pay | Admitting: Internal Medicine

## 2017-12-14 LAB — COLOGUARD: Cologuard: NEGATIVE

## 2017-12-16 ENCOUNTER — Telehealth: Payer: Self-pay | Admitting: *Deleted

## 2017-12-16 ENCOUNTER — Encounter: Payer: Self-pay | Admitting: *Deleted

## 2017-12-16 NOTE — Telephone Encounter (Signed)
Patient advised of negative Cologuard results.

## 2018-03-01 ENCOUNTER — Other Ambulatory Visit: Payer: Self-pay | Admitting: Physician Assistant

## 2018-05-10 NOTE — Progress Notes (Deleted)
FOLLOW UP  Assessment and Plan:   Hypertension Has been at goal off of medications; monitored expectantly  Monitor blood pressure at home; patient to call if consistently greater than 130/80 Continue DASH diet.   Reminder to go to the ER if any CP, SOB, nausea, dizziness, severe HA, changes vision/speech, left arm numbness and tingling and jaw pain.  Cholesterol Currently above goal; on zetia only due to statin intolerance; low risk; continue lifestyle modification to maintain LDL <130 Continue low cholesterol diet and exercise.  Check lipid panel.   Prediabetes Continue diet and exercise.  Perform daily foot/skin check, notify office of any concerning changes.  Check A1C  BMI 25 Continue to recommend diet heavy in fruits and veggies and low in animal meats, cheeses, and dairy products, appropriate calorie intake Discuss exercise recommendations routinely Continue to monitor weight at each visit  Vitamin D Def At goal at last visit; continue supplementation to maintain goal of 60-100 Defer Vit D level  Continue diet and meds as discussed. Further disposition pending results of labs. Discussed med's effects and SE's.   Over 30 minutes of exam, counseling, chart review, and critical decision making was performed.   Future Appointments  Date Time Provider [place]  05/12/2018  8:45 AM Liane Comber, NP [facility] None  11/09/2018  9:00 AM Unk Pinto, MD [facility] None    ----------------------------------------------------------------------------------------------------------------------  HPI 68 y.o. male  presents for 3 month follow up on hypertension, cholesterol, prediabetes, weight and vitamin D deficiency.   BMI is There is no height or weight on file to calculate BMI., he {HAS HAS WUJ:81191} been working on diet and exercise. Wt Readings from Last 3 Encounters:  11/04/17 196 lb 9.6 oz (89.2 kg)  05/14/17 195 lb 3.2 oz (88.5 kg)  10/22/16 192 lb  (87.1 kg)   His blood pressure {HAS HAS NOT:18834} been controlled at home, today their BP is    He {DOES_DOES YNW:29562} workout. He denies chest pain, shortness of breath, dizziness.   He is on cholesterol medication Zetia and denies myalgias. He has hx of reaction to RYRS and pravastatin. His cholesterol is not at goal. The cholesterol last visit was:   Lab Results  Component Value Date   CHOL 204 (H) 11/04/2017   HDL 43 11/04/2017   LDLCALC 122 (H) 11/04/2017   TRIG 251 (H) 11/04/2017   CHOLHDL 4.7 11/04/2017    He {Has/has not:18111} been working on diet and exercise for prediabetes, and denies {Symptoms; diabetes w/o none:19199}. Last A1C in the office was:  Lab Results  Component Value Date   HGBA1C 6.0 (H) 11/04/2017   Patient is on Vitamin D supplement.   Lab Results  Component Value Date   VD25OH 62 11/04/2017        Current Medications:  Current Outpatient Medications on File Prior to Visit  Medication Sig  . aspirin 81 MG tablet Take 81 mg by mouth daily.  . Cholecalciferol (VITAMIN D PO) Take 2,000 Units by mouth 2 (two) times daily.  . Cinnamon 500 MG TABS Take by mouth 2 (two) times daily.  Marland Kitchen ezetimibe (ZETIA) 10 MG tablet TAKE 1 TABLET BY MOUTH EVERY DAY  . OVER THE COUNTER MEDICATION 1,000 mg daily. Tumeric   No current facility-administered medications on file prior to visit.      Allergies:  Allergies  Allergen Reactions  . Pravastatin   . Red Yeast Rice [Cholestin] Rash     Medical History:  Past Medical History:  Diagnosis Date  .  Diabetes mellitus without complication ([facility]) 05/7060   A1c 6.1% preDiabetes  . H/O vasectomy 84  . Hyperlipidemia 2005  . Hypertension 2009   labile-monitoring expectantly  . Hypogonadism male Oct 2014   Testosterone = 255 (Patient declined treatment)  . Vitamin D deficiency 2008   Vit D = 37   Family history- Reviewed and unchanged Social history- Reviewed and unchanged   Review of Systems:  Review of  Systems  Constitutional: Negative for malaise/fatigue and weight loss.  HENT: Negative for hearing loss and tinnitus.   Eyes: Negative for blurred vision and double vision.  Respiratory: Negative for cough, shortness of breath and wheezing.   Cardiovascular: Negative for chest pain, palpitations, orthopnea, claudication and leg swelling.  Gastrointestinal: Negative for abdominal pain, blood in stool, constipation, diarrhea, heartburn, melena, nausea and vomiting.  Genitourinary: Negative.   Musculoskeletal: Negative for joint pain and myalgias.  Skin: Negative for rash.  Neurological: Negative for dizziness, tingling, sensory change, weakness and headaches.  Endo/Heme/Allergies: Negative for polydipsia.  Psychiatric/Behavioral: Negative.   All other systems reviewed and are negative.     Physical Exam: There were no vitals taken for this visit. Wt Readings from Last 3 Encounters:  11/04/17 196 lb 9.6 oz (89.2 kg)  05/14/17 195 lb 3.2 oz (88.5 kg)  10/22/16 192 lb (87.1 kg)   General Appearance: Well nourished, in no apparent distress. Eyes: PERRLA, EOMs, conjunctiva no swelling or erythema Sinuses: No Frontal/maxillary tenderness ENT/Mouth: Ext aud canals clear, TMs without erythema, bulging. No erythema, swelling, or exudate on post pharynx.  Tonsils not swollen or erythematous. Hearing normal.  Neck: Supple, thyroid normal.  Respiratory: Respiratory effort normal, BS equal bilaterally without rales, rhonchi, wheezing or stridor.  Cardio: RRR with no MRGs. Brisk peripheral pulses without edema.  Abdomen: Soft, + BS.  Non tender, no guarding, rebound, hernias, masses. Lymphatics: Non tender without lymphadenopathy.  Musculoskeletal: Full ROM, 5/5 strength, {PSY - GAIT AND STATION:22860} gait Skin: Warm, dry without rashes, lesions, ecchymosis.  Neuro: Cranial nerves intact. No cerebellar symptoms.  Psych: Awake and oriented X 3, normal affect, Insight and Judgment appropriate.     Izora Ribas, NP 7:48 AM [facility]

## 2018-05-12 ENCOUNTER — Encounter: Payer: Self-pay | Admitting: Adult Health

## 2018-05-12 ENCOUNTER — Ambulatory Visit (INDEPENDENT_AMBULATORY_CARE_PROVIDER_SITE_OTHER): Payer: Medicare Other | Admitting: Adult Health

## 2018-05-12 VITALS — BP 134/82 | HR 58 | Temp 97.9°F | Ht 73.0 in | Wt 201.0 lb

## 2018-05-12 DIAGNOSIS — E782 Mixed hyperlipidemia: Secondary | ICD-10-CM | POA: Diagnosis not present

## 2018-05-12 DIAGNOSIS — Z23 Encounter for immunization: Secondary | ICD-10-CM | POA: Diagnosis not present

## 2018-05-12 DIAGNOSIS — Z0001 Encounter for general adult medical examination with abnormal findings: Secondary | ICD-10-CM | POA: Diagnosis not present

## 2018-05-12 DIAGNOSIS — Z6825 Body mass index (BMI) 25.0-25.9, adult: Secondary | ICD-10-CM

## 2018-05-12 DIAGNOSIS — R6889 Other general symptoms and signs: Secondary | ICD-10-CM | POA: Diagnosis not present

## 2018-05-12 DIAGNOSIS — I10 Essential (primary) hypertension: Secondary | ICD-10-CM

## 2018-05-12 DIAGNOSIS — R7303 Prediabetes: Secondary | ICD-10-CM

## 2018-05-12 DIAGNOSIS — Z0000 Encounter for general adult medical examination without abnormal findings: Secondary | ICD-10-CM

## 2018-05-12 DIAGNOSIS — Z79899 Other long term (current) drug therapy: Secondary | ICD-10-CM

## 2018-05-12 DIAGNOSIS — Z87891 Personal history of nicotine dependence: Secondary | ICD-10-CM | POA: Diagnosis not present

## 2018-05-12 DIAGNOSIS — Z136 Encounter for screening for cardiovascular disorders: Secondary | ICD-10-CM

## 2018-05-12 DIAGNOSIS — E559 Vitamin D deficiency, unspecified: Secondary | ICD-10-CM

## 2018-05-12 NOTE — Progress Notes (Signed)
MEDICARE ANNUAL WELLNESS VISIT AND FOLLOW UP Assessment:   [name] was seen today for follow-up.  Diagnoses and all orders for this visit:  Welcome to Medicare preventive visit  Essential hypertension Fairly controlled off of medications Monitor blood pressure at home; call if consistently over 130/80 Continue DASH diet.   Reminder to go to the ER if any CP, SOB, nausea, dizziness, severe HA, changes vision/speech, left arm numbness and tingling and jaw pain. -     EKG 12-Lead -     [place], RETROPERITNL ABD,  LTD  Vitamin D deficiency At goal at recent check; continue to recommend supplementation for goal of 70-100 Defer vitamin D level  Prediabetes Discussed disease and risks Discussed diet/exercise, weight management  -     Hemoglobin A1c  Medication management -     CBC with Differential/Platelet -     COMPLETE METABOLIC PANEL WITH GFR  Hyperlipidemia Continue medications: zetia 10 mg - discussed above LDL goal <100; patient prefers to work on lifestyle, declines switch to statin Continue low cholesterol diet and exercise.  Check lipid panel.  -     Lipid panel -     TSH  BMI 25.0-25.9,adult Continue to recommend diet heavy in fruits and veggies and low in animal meats, cheeses, and dairy products, appropriate calorie intake Discuss exercise recommendations routinely Continue to monitor weight at each visit  Former smoker -     EKG 12-Lead -     [place], RETROPERITNL ABD,  LTD  Need for vaccination against Streptococcus pneumoniae using pneumococcal conjugate vaccine 13 -     Pneumococcal conjugate vaccine 13-valent IM   Over 30 minutes of exam, counseling, chart review, and critical decision making was performed  Future Appointments  Date Time Provider [place]  11/09/2018  9:00 AM Unk Pinto, MD [facility] None     Plan:   During the course of the visit the patient was educated and counseled about appropriate screening and preventive services  including:    Pneumococcal vaccine   Influenza vaccine  Prevnar 13  Td vaccine  Screening electrocardiogram  Colorectal cancer screening  Diabetes screening  Glaucoma screening  Nutrition counseling    Subjective:  Parker Humphrey is a 68 y.o. male who presents for Medicare Annual Wellness Visit and 3 month follow up for HTN, hyperlipidemia, prediabetes, and vitamin D Def.   BMI is Body mass index is 26.52 kg/m., he has been working on diet and exercise. Wt Readings from Last 3 Encounters:  05/12/18 201 lb (91.2 kg)  11/04/17 196 lb 9.6 oz (89.2 kg)  05/14/17 195 lb 3.2 oz (88.5 kg)    His blood pressure has been controlled at home, today their BP is BP: 134/82 He does not workout. He denies chest pain, shortness of breath, dizziness.   He is on cholesterol medication (zeita 10 mg) and denies myalgias. His cholesterol is not at goal. The cholesterol last visit was:   Lab Results  Component Value Date   CHOL 204 (H) 11/04/2017   HDL 43 11/04/2017   LDLCALC 122 (H) 11/04/2017   TRIG 251 (H) 11/04/2017   CHOLHDL 4.7 11/04/2017   He has been working on diet and exercise for prediabetes, and denies increased appetite, nausea, paresthesia of the feet, polydipsia, polyuria, visual disturbances and vomiting. Last A1C in the office was:  Lab Results  Component Value Date   HGBA1C 6.0 (H) 11/04/2017   Last GFR Lab Results  Component Value Date   GFRNONAA 91 11/04/2017  Patient is on Vitamin D supplement.   Lab Results  Component Value Date   VD25OH 62 11/04/2017      Medication Review:   Current Outpatient Medications (Cardiovascular):  .  ezetimibe (ZETIA) 10 MG tablet, TAKE 1 TABLET BY MOUTH EVERY DAY   Current Outpatient Medications (Analgesics):  .  aspirin 81 MG tablet, Take 81 mg by mouth daily.   Current Outpatient Medications (Other):  Marland Kitchen  Cholecalciferol (VITAMIN D PO), Take 2,000 Units by mouth daily.  .  Cinnamon 500 MG TABS, Take by mouth 2  (two) times daily. .  TURMERIC PO, Take 1,000 mg by mouth 2 (two) times daily.  Allergies: Allergies  Allergen Reactions  . Pravastatin   . Red Yeast Rice [Cholestin] Rash    Current Problems (verified) has Essential hypertension; Hyperlipidemia; Vitamin D deficiency; Prediabetes; and Medication management on their problem list.  Screening Tests Immunization History  Administered Date(s) Administered  . DTaP 04/14/2009  . Influenza Split 01/12/2013  . PPD Test 09/14/2013, 09/19/2014, 10/02/2015, 11/04/2017  . Pneumococcal Polysaccharide-23 04/14/1997  . Td 04/14/1997  . Tdap 07/31/2009    Preventative care: Last colonoscopy: 2006 Last cologuard: 12/2017  Prior vaccinations: TD or Tdap: 2011  Influenza: Declines  Pneumococcal: 1999, due 1 year Prevnar13: DUE  Shingles/Zostavax: declines  Names of Other Physician/Practitioners you currently use: 1. [facility] here for primary care 2. [facility], eye doctor, last visit 2017, needs to schedule  3. Dr. ? , dentist, last visit, 2020, goes q62m  Patient Care Team: Unk Pinto, MD as PCP - General (Internal Medicine)  Surgical: He  has a past surgical history that includes Knee arthroscopy (Left, 2010); Vasectomy (Bilateral, 1987); Inguinal hernia repair (Bilateral, 2009); Inguinal hernia repair (Left, 1997); Inguinal hernia repair (Left, 1998); and Colonoscopy (N/A, 2006). Family His family history includes Cancer in his father; Diabetes in his brother; Heart disease in his father; Hyperlipidemia in his brother. Social history  He reports that he quit smoking about 26 years ago. He quit after 10.00 years of use. He has never used smokeless tobacco. He reports that he does not drink alcohol or use drugs.  MEDICARE WELLNESS OBJECTIVES: Physical activity: Current Exercise Habits: The patient does not participate in regular exercise at present, Exercise limited by: None  identified Cardiac risk factors: Cardiac Risk Factors include: advanced age (>53men, >58 women);dyslipidemia;hypertension;male gender;family history of premature cardiovascular disease Depression/mood screen:   Depression screen [facility] 2/9 05/12/2018  Decreased Interest 0  Down, Depressed, Hopeless 0  PHQ - 2 Score 0    ADLs:  In your present state of health, do you have any difficulty performing the following activities: 05/12/2018 11/08/2017  Hearing? N N  Vision? N N  Difficulty concentrating or making decisions? N N  Walking or climbing stairs? N N  Dressing or bathing? N N  Doing errands, shopping? N N  Some recent data might be hidden     Cognitive Testing  Alert? Yes  Normal Appearance?Yes  Oriented to person? Yes  Place? Yes   Time? Yes  Recall of three objects?  Yes  Can perform simple calculations? Yes  Displays appropriate judgment?Yes  Can read the correct time from a watch face?Yes  EOL planning: Does Patient Have a Medical Advance Directive?: No Would patient like information on creating a medical advance directive?: No - Patient declined   Objective:   Today's Vitals   05/12/18 0841  BP: 134/82  Pulse: (!) 58  Temp: 97.9  F (36.6 C)  SpO2: 98%  Weight: 201 lb (91.2 kg)  Height: 6\' 1"  (1.854 m)   Body mass index is 26.52 kg/m.  General appearance: alert, no distress, WD/WN, male HEENT: normocephalic, sclerae anicteric, TMs pearly, nares patent, no discharge or erythema, pharynx normal Oral cavity: MMM, no lesions Neck: supple, no lymphadenopathy, no thyromegaly, no masses Heart: RRR, normal S1, S2, no murmurs Lungs: CTA bilaterally, no wheezes, rhonchi, or rales Abdomen: +bs, soft, non tender, non distended, no masses, no hepatomegaly, no splenomegaly Musculoskeletal: nontender, no swelling, no obvious deformity Extremities: no edema, no cyanosis, no clubbing Pulses: 2+ symmetric, upper and lower extremities, normal cap refill Neurological: alert,  oriented x 3, CN2-12 intact, strength normal upper extremities and lower extremities, sensation normal throughout, DTRs 2+ throughout, no cerebellar signs, gait normal Psychiatric: normal affect, behavior normal, pleasant   Medicare Attestation I have personally reviewed: The patient's medical and social history Their use of alcohol, tobacco or illicit drugs Their current medications and supplements The patient's functional ability including ADLs,fall risks, home safety risks, cognitive, and hearing and visual impairment Diet and physical activities Evidence for depression or mood disorders  The patient's weight, height, BMI, and visual acuity have been recorded in the chart.  I have made referrals, counseling, and provided education to the patient based on review of the above and I have provided the patient with a written personalized care plan for preventive services.     Izora Ribas, NP   05/12/2018

## 2018-05-12 NOTE — Patient Instructions (Addendum)
  [name] , Thank you for taking time to come for your Medicare Wellness Visit. I appreciate your ongoing commitment to your health goals. Please review the following plan we discussed and let me know if I can assist you in the future.   These are the goals we discussed: Goals    . Exercise 150 min/wk Moderate Activity     15-20 min daily of brisk walking for cardiovascular health       This is a list of the screening recommended for you and due dates:  Health Maintenance  Topic Date Due  . Pneumonia vaccines (1 of 2 - PCV13) 02/25/2018  . Flu Shot  07/13/2018*  . Tetanus Vaccine  08/01/2019  . Cologuard (Stool DNA test)  12/14/2020  .  Hepatitis C: One time screening is recommended by Center for Disease Control  (CDC) for  adults born from [birth year] through 1963.   Completed  . HIV Screening  Completed  *Topic was postponed. The date shown is not the original due date.    Know what a healthy weight is for you (roughly BMI <25) and aim to maintain this  Aim for 7+ servings of fruits and vegetables daily  65-80+ fluid ounces of water or unsweet tea for healthy kidneys  Limit to max 1 drink of alcohol per day; avoid smoking/tobacco  Limit animal fats in diet for cholesterol and heart health - choose grass fed whenever available  Avoid highly processed foods, and foods high in saturated/trans fats  Aim for low stress - take time to unwind and care for your mental health  Aim for 150 min of moderate intensity exercise weekly for heart health, and weights twice weekly for bone health  Aim for 7-9 hours of sleep daily

## 2018-05-13 LAB — CBC WITH DIFFERENTIAL/PLATELET
Absolute Monocytes: 467 cells/uL (ref 200–950)
BASOS ABS: 40 {cells}/uL (ref 0–200)
Basophils Relative: 0.7 %
EOS ABS: 120 {cells}/uL (ref 15–500)
EOS PCT: 2.1 %
HEMATOCRIT: 46.9 % (ref 38.5–50.0)
HEMOGLOBIN: 15.5 g/dL (ref 13.2–17.1)
LYMPHS ABS: 1408 {cells}/uL (ref 850–3900)
MCH: 28.7 pg (ref 27.0–33.0)
MCHC: 33 g/dL (ref 32.0–36.0)
MCV: 86.7 fL (ref 80.0–100.0)
MONOS PCT: 8.2 %
MPV: 10.9 fL (ref 7.5–12.5)
NEUTROS ABS: 3665 {cells}/uL (ref 1500–7800)
NEUTROS PCT: 64.3 %
Platelets: 276 10*3/uL (ref 140–400)
RBC: 5.41 10*6/uL (ref 4.20–5.80)
RDW: 12.5 % (ref 11.0–15.0)
Total Lymphocyte: 24.7 %
WBC: 5.7 10*3/uL (ref 3.8–10.8)

## 2018-05-13 LAB — COMPLETE METABOLIC PANEL WITH GFR
AG Ratio: 1.8 (calc) (ref 1.0–2.5)
ALBUMIN MSPROF: 4.4 g/dL (ref 3.6–5.1)
ALKALINE PHOSPHATASE (APISO): 45 U/L (ref 40–115)
ALT: 27 U/L (ref 9–46)
AST: 21 U/L (ref 10–35)
BILIRUBIN TOTAL: 0.7 mg/dL (ref 0.2–1.2)
BUN: 19 mg/dL (ref 7–25)
CHLORIDE: 105 mmol/L (ref 98–110)
CO2: 28 mmol/L (ref 20–32)
CREATININE: 0.82 mg/dL (ref 0.70–1.25)
Calcium: 10.2 mg/dL (ref 8.6–10.3)
GFR, Est African American: 108 mL/min/{1.73_m2} (ref >=60)
GFR, Est Non African American: 93 mL/min/{1.73_m2} (ref >=60)
GLOBULIN: 2.4 g/dL (ref 1.9–3.7)
Glucose, Bld: 106 mg/dL — ABNORMAL HIGH (ref 65–99)
POTASSIUM: 4.4 mmol/L (ref 3.5–5.3)
SODIUM: 141 mmol/L (ref 135–146)
Total Protein: 6.8 g/dL (ref 6.1–8.1)

## 2018-05-13 LAB — HEMOGLOBIN A1C
EAG (MMOL/L): 6.6 (calc)
Hgb A1c MFr Bld: 5.8 % of total Hgb — ABNORMAL HIGH (ref <5.7)
Mean Plasma Glucose: 120 (calc)

## 2018-05-13 LAB — LIPID PANEL
CHOLESTEROL: 199 mg/dL (ref <200)
HDL: 45 mg/dL (ref >40)
LDL Cholesterol (Calc): 123 mg/dL (calc) — ABNORMAL HIGH
Non-HDL Cholesterol (Calc): 154 mg/dL (calc) — ABNORMAL HIGH (ref <130)
Total CHOL/HDL Ratio: 4.4 (calc) (ref <5.0)
Triglycerides: 191 mg/dL — ABNORMAL HIGH (ref <150)

## 2018-05-13 LAB — TSH: TSH: 1.97 mIU/L (ref 0.40–4.50)

## 2018-09-09 ENCOUNTER — Other Ambulatory Visit: Payer: Self-pay | Admitting: Adult Health

## 2018-11-09 ENCOUNTER — Encounter: Payer: Self-pay | Admitting: Internal Medicine

## 2018-12-25 ENCOUNTER — Encounter: Payer: Self-pay | Admitting: Internal Medicine

## 2018-12-25 NOTE — Progress Notes (Signed)
Comprehensive Evaluation & Examination     This very nice 68 y.o. MWM presents for a comprehensive evaluation and management of multiple medical co-morbidities.  Patient has been followed for HTN, HLD, Prediabetes and Vitamin D Deficiency.     Labile HTN predates circa 2002. Patient's BP has been controlled at home.  Today's BP is at goal - 136/84. Patient denies any cardiac symptoms as chest pain, palpitations, shortness of breath, dizziness or ankle swelling.     Patient is statin intolerant & his hyperlipidemia is not controlled with diet and Zetia. Patient denies myalgias or other medication SE's. Last lipids were not at goal: Lab Results  Component Value Date   CHOL 199 05/12/2018   HDL 45 05/12/2018   LDLCALC 123 (H) 05/12/2018   TRIG 191 (H) 05/12/2018   CHOLHDL 4.4 05/12/2018      Patient has hx/o prediabetes (A1c 6.1% / 2012 and 6.0% / 2016) and patient denies reactive hypoglycemic symptoms, visual blurring, diabetic polys or paresthesias. Last A1c was not at goal: Lab Results  Component Value Date   HGBA1C 5.8 (H) 05/12/2018       Finally, patient has history of Vitamin D Deficiency ("37" / 2008) and last vitamin D was at goal: Lab Results  Component Value Date   VD25OH 62 11/04/2017   Current Outpatient Medications on File Prior to Visit  Medication Sig  . aspirin 81 MG tablet Take 81 mg by mouth daily.  . Cholecalciferol (VITAMIN D PO) Take 2,000 Units by mouth daily.   . Cinnamon 500 MG TABS Take by mouth 2 (two) times daily.  Marland Kitchen ezetimibe (ZETIA) 10 MG tablet TAKE 1 TABLET BY MOUTH EVERY DAY  . TURMERIC PO Take 1,000 mg by mouth 2 (two) times daily.   No current facility-administered medications on file prior to visit.    Allergies  Allergen Reactions  . Pravastatin   . Red Yeast Rice [Cholestin] Rash   Past Medical History:  Diagnosis Date  . Diabetes mellitus without complication ([place]) 123456   A1c 6.1% preDiabetes  . H/O vasectomy 55  .  Hyperlipidemia 2005  . Hypertension 2009   labile-monitoring expectantly  . Hypogonadism male Oct 2014   Testosterone = 255 (Patient declined treatment)  . Vitamin D deficiency 2008   Vit D = 37   Health Maintenance  Topic Date Due  . INFLUENZA VACCINE  11/13/2018  . PNA vac Low Risk Adult (2 of 2 - PPSV23) 05/13/2019  . TETANUS/TDAP  08/01/2019  . Fecal DNA (Cologuard)  12/14/2020  . Hepatitis C Screening  Completed  . HIV Screening  Completed   Immunization History  Administered Date(s) Administered  . DTaP 04/14/2009  . Influenza Split 01/12/2013  . PPD Test 09/14/2013, 09/19/2014, 10/02/2015, 11/04/2017  . Pneumococcal Conjugate-13 05/12/2018  . Pneumococcal Polysaccharide-23 04/14/1997  . Td 04/14/1997  . Tdap 07/31/2009   Last Colon - 09/23/2004 - Dr Sharlett Iles - recc 10 year f/u.  Cologard - 12/27/2018 - Negative - 3 year f/u due Sept 2023  Past Surgical History:  Procedure Laterality Date  . COLONOSCOPY N/A 2006  . INGUINAL HERNIA REPAIR Bilateral 2009  . INGUINAL HERNIA REPAIR Left 1997  . INGUINAL HERNIA REPAIR Left 1998  . KNEE ARTHROSCOPY Left 2010  . VASECTOMY Bilateral 1987   Family History  Problem Relation Age of Onset  . Cancer Father   . Heart disease Father   . Diabetes Brother   . Hyperlipidemia Brother    Social History  Socioeconomic History  . Marital status: Married x 40 yrs    Spouse name: Katrina  . Number of children: 2 sons   Occupational History  . retired Economist  Tobacco Use  . Smoking status: Former Smoker    Years: 10.00    Quit date: 09/15/1991    Years since quitting: 27.2  . Smokeless tobacco: Never Used  Substance and Sexual Activity  . Alcohol use: No  . Drug use: No  . Sexual activity: Yes    ROS Constitutional: Denies fever, chills, weight loss/gain, headaches, insomnia,  night sweats or change in appetite. Does c/o fatigue. Eyes: Denies redness, blurred vision, diplopia, discharge, itchy or watery eyes.   ENT: Denies discharge, congestion, post nasal drip, epistaxis, sore throat, earache, hearing loss, dental pain, Tinnitus, Vertigo, Sinus pain or snoring.  Cardio: Denies chest pain, palpitations, irregular heartbeat, syncope, dyspnea, diaphoresis, orthopnea, PND, claudication or edema Respiratory: denies cough, dyspnea, DOE, pleurisy, hoarseness, laryngitis or wheezing.  Gastrointestinal: Denies dysphagia, heartburn, reflux, water brash, pain, cramps, nausea, vomiting, bloating, diarrhea, constipation, hematemesis, melena, hematochezia, jaundice or hemorrhoids Genitourinary: Denies dysuria, frequency, urgency, nocturia, hesitancy, discharge, hematuria or flank pain Musculoskeletal: Denies arthralgia, myalgia, stiffness, Jt. Swelling, pain, limp or strain/sprain. Denies Falls. Skin: Denies puritis, rash, hives, warts, acne, eczema or change in skin lesion Neuro: No weakness, tremor, incoordination, spasms, paresthesia or pain Psychiatric: Denies confusion, memory loss or sensory loss. Denies Depression. Endocrine: Denies change in weight, skin, hair change, nocturia, and paresthesia, diabetic polys, visual blurring or hyper / hypo glycemic episodes.  Heme/Lymph: No excessive bleeding, bruising or enlarged lymph nodes.  Physical Exam  BP 136/84   Pulse 60   Temp (!) 97 F (36.1 C)   Resp 16   Ht 6' 0.5" (1.842 m)   Wt 200 lb (90.7 kg)   BMI 26.75 kg/m   General Appearance: Well nourished and well groomed and in no apparent distress.  Eyes: PERRLA, EOMs, conjunctiva no swelling or erythema, normal fundi and vessels. Sinuses: No frontal/maxillary tenderness ENT/Mouth: EACs patent / TMs  nl. Nares clear without erythema, swelling, mucoid exudates. Oral hygiene is good. No erythema, swelling, or exudate. Tongue normal, non-obstructing. Tonsils not swollen or erythematous. Hearing normal.  Neck: Supple, thyroid not palpable. No bruits, nodes or JVD. Respiratory: Respiratory effort normal.  BS  equal and clear bilateral without rales, rhonci, wheezing or stridor. Cardio: Heart sounds are normal with regular rate and rhythm and no murmurs, rubs or gallops. Peripheral pulses are normal and equal bilaterally without edema. No aortic or femoral bruits. Chest: symmetric with normal excursions and percussion.  Abdomen: Soft, with Nl bowel sounds. Nontender, no guarding, rebound, hernias, masses, or organomegaly.  Lymphatics: Non tender without lymphadenopathy.  Musculoskeletal: Full ROM all peripheral extremities, joint stability, 5/5 strength, and normal gait. Skin: Warm and dry without rashes, lesions, cyanosis, clubbing or  ecchymosis.  Neuro: Cranial nerves intact, reflexes equal bilaterally. Normal muscle tone, no cerebellar symptoms. Sensation intact.  Pysch: Alert and oriented X 3 with normal affect, insight and judgment appropriate.   Assessment and Plan  1. Labile hypertension  - EKG 12-Lead - [place], RETROPERITNL ABD,  LTD - Urinalysis, Routine w reflex microscopic - Microalbumin / creatinine urine ratio - CBC with Differential/Platelet - COMPLETE METABOLIC PANEL WITH GFR - Magnesium - TSH  2. Hyperlipidemia, mixed  - EKG 12-Lead - [place], RETROPERITNL ABD,  LTD - Lipid panel - TSH  3. Abnormal glucose  - EKG 12-Lead - [place], RETROPERITNL ABD,  LTD - Hemoglobin A1c - Insulin, random  4. Vitamin D deficiency  - VITAMIN D 25 Hydroxyl  5. Prediabetes  - EKG 12-Lead - [place], RETROPERITNL ABD,  LTD - Hemoglobin A1c - Insulin, random  6. BPH with obstruction/lower urinary tract symptoms  - PSA  7. Prostate cancer screening  - PSA  8. Screening for colorectal cancer  - POC Hemoccult Bld/Stl  9. Screening for ischemic heart disease  - EKG 12-Lead  10. FHx: heart disease  - EKG 12-Lead - [place], RETROPERITNL ABD,  LTD  11. Former smoker  - EKG 12-Lead - [place], RETROPERITNL ABD,  LTD  12. Screening for AAA (aortic abdominal aneurysm)  - [place], RETROPERITNL ABD,   LTD  13. Medication management  - Urinalysis, Routine w reflex microscopic - Microalbumin / creatinine urine ratio - CBC with Differential/Platelet - COMPLETE METABOLIC PANEL WITH GFR - Magnesium - Lipid panel - TSH - Hemoglobin A1c - Insulin, random - VITAMIN D 25 Hydroxyl        Patient was counseled in prudent diet, weight control to achieve/maintain BMI less than 25, BP monitoring, regular exercise and medications as discussed.  Discussed med effects and SE's. Routine screening labs and tests as requested with regular follow-up as recommended. Over 40 minutes of exam, counseling, chart review and high complex critical decision making was performed   Kirtland Bouchard, MD

## 2018-12-25 NOTE — Patient Instructions (Signed)

## 2018-12-27 ENCOUNTER — Other Ambulatory Visit: Payer: Self-pay

## 2018-12-27 ENCOUNTER — Ambulatory Visit (INDEPENDENT_AMBULATORY_CARE_PROVIDER_SITE_OTHER): Payer: Medicare Other | Admitting: Internal Medicine

## 2018-12-27 VITALS — BP 136/84 | HR 60 | Temp 97.0°F | Resp 16 | Ht 72.5 in | Wt 200.0 lb

## 2018-12-27 DIAGNOSIS — R7303 Prediabetes: Secondary | ICD-10-CM | POA: Diagnosis not present

## 2018-12-27 DIAGNOSIS — Z8249 Family history of ischemic heart disease and other diseases of the circulatory system: Secondary | ICD-10-CM | POA: Diagnosis not present

## 2018-12-27 DIAGNOSIS — N401 Enlarged prostate with lower urinary tract symptoms: Secondary | ICD-10-CM | POA: Diagnosis not present

## 2018-12-27 DIAGNOSIS — R7309 Other abnormal glucose: Secondary | ICD-10-CM | POA: Diagnosis not present

## 2018-12-27 DIAGNOSIS — I10 Essential (primary) hypertension: Secondary | ICD-10-CM | POA: Diagnosis not present

## 2018-12-27 DIAGNOSIS — Z136 Encounter for screening for cardiovascular disorders: Secondary | ICD-10-CM | POA: Diagnosis not present

## 2018-12-27 DIAGNOSIS — Z79899 Other long term (current) drug therapy: Secondary | ICD-10-CM

## 2018-12-27 DIAGNOSIS — E782 Mixed hyperlipidemia: Secondary | ICD-10-CM

## 2018-12-27 DIAGNOSIS — E559 Vitamin D deficiency, unspecified: Secondary | ICD-10-CM | POA: Diagnosis not present

## 2018-12-27 DIAGNOSIS — N138 Other obstructive and reflux uropathy: Secondary | ICD-10-CM

## 2018-12-27 DIAGNOSIS — Z1211 Encounter for screening for malignant neoplasm of colon: Secondary | ICD-10-CM

## 2018-12-27 DIAGNOSIS — R0989 Other specified symptoms and signs involving the circulatory and respiratory systems: Secondary | ICD-10-CM

## 2018-12-27 DIAGNOSIS — Z87891 Personal history of nicotine dependence: Secondary | ICD-10-CM | POA: Diagnosis not present

## 2018-12-27 DIAGNOSIS — Z125 Encounter for screening for malignant neoplasm of prostate: Secondary | ICD-10-CM

## 2018-12-27 DIAGNOSIS — Z1212 Encounter for screening for malignant neoplasm of rectum: Secondary | ICD-10-CM | POA: Diagnosis not present

## 2018-12-28 LAB — COMPLETE METABOLIC PANEL WITH GFR
AG Ratio: 1.7 (calc) (ref 1.0–2.5)
ALT: 18 U/L (ref 9–46)
AST: 18 U/L (ref 10–35)
Albumin: 4.5 g/dL (ref 3.6–5.1)
Alkaline phosphatase (APISO): 45 U/L (ref 35–144)
BUN: 21 mg/dL (ref 7–25)
CO2: 28 mmol/L (ref 20–32)
Calcium: 9.7 mg/dL (ref 8.6–10.3)
Chloride: 105 mmol/L (ref 98–110)
Creat: 0.9 mg/dL (ref 0.70–1.25)
GFR, Est African American: 104 mL/min/{1.73_m2} (ref >=60)
GFR, Est Non African American: 89 mL/min/{1.73_m2} (ref >=60)
Globulin: 2.6 g/dL (calc) (ref 1.9–3.7)
Glucose, Bld: 111 mg/dL — ABNORMAL HIGH (ref 65–99)
Potassium: 4.3 mmol/L (ref 3.5–5.3)
Sodium: 140 mmol/L (ref 135–146)
Total Bilirubin: 0.9 mg/dL (ref 0.2–1.2)
Total Protein: 7.1 g/dL (ref 6.1–8.1)

## 2018-12-28 LAB — URINALYSIS, ROUTINE W REFLEX MICROSCOPIC
Bilirubin Urine: NEGATIVE
Glucose, UA: NEGATIVE
Hgb urine dipstick: NEGATIVE
Ketones, ur: NEGATIVE
Leukocytes,Ua: NEGATIVE
Nitrite: NEGATIVE
Protein, ur: NEGATIVE
Specific Gravity, Urine: 1.029 (ref 1.001–1.03)
pH: 5.5 (ref 5.0–8.0)

## 2018-12-28 LAB — CBC WITH DIFFERENTIAL/PLATELET
Absolute Monocytes: 426 cells/uL (ref 200–950)
Basophils Absolute: 62 cells/uL (ref 0–200)
Basophils Relative: 1.1 %
Eosinophils Absolute: 151 cells/uL (ref 15–500)
Eosinophils Relative: 2.7 %
HCT: 47.5 % (ref 38.5–50.0)
Hemoglobin: 16 g/dL (ref 13.2–17.1)
Lymphs Abs: 1282 cells/uL (ref 850–3900)
MCH: 29 pg (ref 27.0–33.0)
MCHC: 33.7 g/dL (ref 32.0–36.0)
MCV: 86.2 fL (ref 80.0–100.0)
MPV: 11 fL (ref 7.5–12.5)
Monocytes Relative: 7.6 %
Neutro Abs: 3679 cells/uL (ref 1500–7800)
Neutrophils Relative %: 65.7 %
Platelets: 264 10*3/uL (ref 140–400)
RBC: 5.51 10*6/uL (ref 4.20–5.80)
RDW: 12.6 % (ref 11.0–15.0)
Total Lymphocyte: 22.9 %
WBC: 5.6 10*3/uL (ref 3.8–10.8)

## 2018-12-28 LAB — HEMOGLOBIN A1C
Hgb A1c MFr Bld: 6 % of total Hgb — ABNORMAL HIGH (ref <5.7)
Mean Plasma Glucose: 126 (calc)
eAG (mmol/L): 7 (calc)

## 2018-12-28 LAB — MAGNESIUM: Magnesium: 1.9 mg/dL (ref 1.5–2.5)

## 2018-12-28 LAB — MICROALBUMIN / CREATININE URINE RATIO
Creatinine, Urine: 236 mg/dL (ref 20–320)
Microalb Creat Ratio: 5 mcg/mg creat (ref <30)
Microalb, Ur: 1.1 mg/dL

## 2018-12-28 LAB — LIPID PANEL
Cholesterol: 206 mg/dL — ABNORMAL HIGH (ref <200)
HDL: 41 mg/dL (ref >=40)
LDL Cholesterol (Calc): 130 mg/dL (calc) — ABNORMAL HIGH
Non-HDL Cholesterol (Calc): 165 mg/dL (calc) — ABNORMAL HIGH (ref <130)
Total CHOL/HDL Ratio: 5 (calc) — ABNORMAL HIGH (ref <5.0)
Triglycerides: 210 mg/dL — ABNORMAL HIGH (ref <150)

## 2018-12-28 LAB — PSA: PSA: 2.9 ng/mL (ref <=4.0)

## 2018-12-28 LAB — INSULIN, RANDOM: Insulin: 5.3 u[IU]/mL

## 2018-12-28 LAB — VITAMIN D 25 HYDROXY (VIT D DEFICIENCY, FRACTURES): Vit D, 25-Hydroxy: 69 ng/mL (ref 30–100)

## 2018-12-28 LAB — TSH: TSH: 2.4 mIU/L (ref 0.40–4.50)

## 2019-01-19 DIAGNOSIS — H35363 Drusen (degenerative) of macula, bilateral: Secondary | ICD-10-CM | POA: Diagnosis not present

## 2019-01-19 DIAGNOSIS — H04123 Dry eye syndrome of bilateral lacrimal glands: Secondary | ICD-10-CM | POA: Diagnosis not present

## 2019-03-25 DIAGNOSIS — M1712 Unilateral primary osteoarthritis, left knee: Secondary | ICD-10-CM | POA: Diagnosis not present

## 2019-03-30 ENCOUNTER — Other Ambulatory Visit: Payer: Self-pay | Admitting: Internal Medicine

## 2019-03-31 ENCOUNTER — Telehealth: Payer: Self-pay | Admitting: *Deleted

## 2019-03-31 NOTE — Telephone Encounter (Signed)
Clearance form and records faxed to [facility].

## 2019-05-03 DIAGNOSIS — M25562 Pain in left knee: Secondary | ICD-10-CM | POA: Diagnosis not present

## 2019-05-09 NOTE — Progress Notes (Signed)
MEDICARE ANNUAL WELLNESS VISIT AND FOLLOW UP / SURGICAL CLEARANCE  Assessment:   [name] was seen today for follow-up.  Diagnoses and all orders for this visit:  Medicare annual wellness visit, second Yearly  Laible hypertension Controlled no medications Monitor blood pressure at home; call if consistently over 130/80 Continue DASH diet.   Reminder to go to the ER if any CP, SOB, nausea, dizziness, severe HA, changes vision/speech, left arm numbness and tingling and jaw pain.   Hyperlipidemia, mixed Continue medications: zetia 10 mg -  LDL goal <100; patient prefers to work on lifestyle, declines switch to statin Continue low cholesterol diet and exercise.  Check lipid panel.  -     Lipid panel -     TSH  Vitamin D deficiency At goal at recent check; continue to recommend supplementation for goal of 70-100 Defer vitamin D level  Abnormal glucose Discussed disease and risks Discussed diet/exercise, weight management  Well controlled last A1c 6   BMI 27.0-27.9,adult Continue to recommend diet heavy in fruits and veggies and low in animal meats, cheeses, and dairy products, appropriate calorie intake Discuss exercise recommendations routinely Continue to monitor weight at each visit  BPH with LUTS Doing well at this time Continue to monitor  Encounter for pre-procedural examination CBC w/diff, CMP, INR Will fax results to [facility]  Medication management Continued  Over 30 minutes of exam, counseling, chart review, and critical decision making was performed  Future Appointments  Date Time Provider [place]  01/11/2020 11:00 AM Unk Pinto, MD [facility] None     Plan:   During the course of the visit the patient was educated and counseled about appropriate screening and preventive services including:    Pneumococcal vaccine   Influenza vaccine  Prevnar 13  Td vaccine  Screening electrocardiogram  Colorectal cancer  screening  Diabetes screening  Glaucoma screening  Nutrition counseling    Subjective:  Parker Humphrey. is a 69 y.o. male who presents for Medicare Annual Wellness Visit and 3 month follow up for HTN, HLD, BPH, abnormal glucose, weight and vitamin D Def.   He is scheduled for outpatient knee surgery on 05/22/18 by Dr Maureen Ralphs, [facility].  This will done outpatient.  Labs have been requested per their office and will be drawn today.  He has been given presurgical instructions regarding his medications as well as expectations post surgery.  Reports his wife had same surgery one year ago. Labs: 12/27/18           A1c 6.0                      B/P controlled           CRE 0.90     /   GFR NONAA 89             BMI is Body mass index is 27.42 kg/m., he has been working on diet and exercise. Wt Readings from Last 3 Encounters:  05/10/19 205 lb (93 kg)  12/27/18 200 lb (90.7 kg)  05/12/18 201 lb (91.2 kg)    His blood pressure has been controlled at home, today their BP is BP: 124/86 He does not workout related to orthopedic constraints with his knee. He denies chest pain, shortness of breath, dizziness.   He is on cholesterol medication (zeita 10 mg) and denies myalgias. His cholesterol is not at goal. The cholesterol last visit was:   Lab Results  Component Value Date  CHOL 206 (H) 12/27/2018   HDL 41 12/27/2018   LDLCALC 130 (H) 12/27/2018   TRIG 210 (H) 12/27/2018   CHOLHDL 5.0 (H) 12/27/2018   He has been working on diet and exercise for prediabetes, and denies increased appetite, nausea, paresthesia of the feet, polydipsia, polyuria, visual disturbances and vomiting. Last A1C in the office was:  Lab Results  Component Value Date   HGBA1C 6.0 (H) 12/27/2018   Last GFR Lab Results  Component Value Date   GFRNONAA 89 12/27/2018   Patient is on Vitamin D supplement.   Lab Results  Component Value Date   VD25OH 69 12/27/2018      Medication Review:   Current  Outpatient Medications (Cardiovascular):  .  ezetimibe (ZETIA) 10 MG tablet, Take 1 tablet Daily for Cholesterol   Current Outpatient Medications (Analgesics):  .  aspirin 81 MG tablet, Take 81 mg by mouth daily.   Current Outpatient Medications (Other):  Marland Kitchen  Cholecalciferol (VITAMIN D PO), Take 2,000 Units by mouth daily.  .  Cinnamon 500 MG TABS, Take by mouth 2 (two) times daily. .  TURMERIC PO, Take 1,000 mg by mouth 2 (two) times daily. .  Zinc 50 MG TABS, Take by mouth.  Allergies: Allergies  Allergen Reactions  . Pravastatin   . Red Yeast Rice [Cholestin] Rash    Current Problems (verified) has Essential hypertension; Hyperlipidemia; Vitamin D deficiency; Prediabetes; and Medication management on their problem list.  Screening Tests Immunization History  Administered Date(s) Administered  . DTaP 04/14/2009  . Influenza Split 01/12/2013  . PPD Test 09/14/2013, 09/19/2014, 10/02/2015, 11/04/2017  . Pneumococcal Conjugate-13 05/12/2018  . Pneumococcal Polysaccharide-23 04/14/1997  . Td 04/14/1997  . Tdap 07/31/2009    Preventative care: Last colonoscopy: 2006 Last cologuard: 12/2017  Prior vaccinations: TD or Tdap: 2011  Influenza: Declines  Pneumococcal: 1999 Prevnar13: 04/2018 Shingles/Zostavax: declines  Names of Other Physician/Practitioners you currently use: 1. [facility] here for primary care 2. [facility], eye doctor, last visit 2017, needs to schedule 01/2019 3. Dr. ? , dentist, last visit, 2020, goes q47m  Patient Care Team: Unk Pinto, MD as PCP - General (Internal Medicine)  Surgical: He  has a past surgical history that includes Knee arthroscopy (Left, 2010); Vasectomy (Bilateral, 1987); Inguinal hernia repair (Bilateral, 2009); Inguinal hernia repair (Left, 1997); Inguinal hernia repair (Left, 1998); and Colonoscopy (N/A, 2006). Family His family history includes Cancer in his father; Diabetes in his  brother; Heart disease in his father; Hyperlipidemia in his brother. Social history  He reports that he quit smoking about 27 years ago. He quit after 10.00 years of use. He has never used smokeless tobacco. He reports that he does not drink alcohol or use drugs.  MEDICARE WELLNESS OBJECTIVES: Physical activity: Current Exercise Habits: Home exercise routine, Type of exercise: calisthenics;strength training/weights;walking, Time (Minutes): 20, Frequency (Times/Week): 7, Weekly Exercise (Minutes/Week): 140, Intensity: Mild, Exercise limited by: None identified Cardiac risk factors: Cardiac Risk Factors include: none Depression/mood screen:   Depression screen [facility] 2/9 05/10/2019  Decreased Interest 0  Down, Depressed, Hopeless 0  PHQ - 2 Score 0    ADLs:  In your present state of health, do you have any difficulty performing the following activities: 05/10/2019 12/25/2018  Hearing? N N  Vision? N N  Difficulty concentrating or making decisions? N N  Walking or climbing stairs? N N  Dressing or bathing? N N  Doing errands, shopping? N N  Preparing Food and eating ?  N -  Using the Toilet? N -  In the past six months, have you accidently leaked urine? N -  Do you have problems with loss of bowel control? N -  Managing your Medications? N -  Managing your Finances? N -  Housekeeping or managing your Housekeeping? N -  Some recent data might be hidden     Cognitive Testing  Alert? Yes  Normal Appearance?Yes  Oriented to person? Yes  Place? Yes   Time? Yes  Recall of three objects?  Yes  Can perform simple calculations? Yes  Displays appropriate judgment?Yes  Can read the correct time from a watch face?Yes  EOL planning: Does Patient Have a Medical Advance Directive?: No Would patient like information on creating a medical advance directive?: No - Patient declined   Objective:   Today's Vitals   05/10/19 0903  BP: 124/86  Pulse: 67  Temp: (!) 96.6 F (35.9 C)  SpO2: 99%   Weight: 205 lb (93 kg)  PainSc: 5   PainLoc: Knee   Body mass index is 27.42 kg/m.  General appearance: alert, no distress, WD/WN, male HEENT: normocephalic, sclerae anicteric, TMs pearly, nares patent, no discharge or erythema, pharynx normal Oral cavity: MMM, no lesions Neck: supple, no lymphadenopathy, no thyromegaly, no masses Heart: RRR, normal S1, S2, no murmurs Lungs: CTA bilaterally, no wheezes, rhonchi, or rales Abdomen: +bs, soft, non tender, non distended, no masses, no hepatomegaly, no splenomegaly Musculoskeletal: nontender, no swelling, no obvious deformity Extremities: no edema, no cyanosis, no clubbing Pulses: 2+ symmetric, upper and lower extremities, normal cap refill Neurological: alert, oriented x 3, CN2-12 intact, strength normal upper extremities and lower extremities, sensation normal throughout, DTRs 2+ throughout, no cerebellar signs, gait normal Psychiatric: normal affect, behavior normal, pleasant   Medicare Attestation I have personally reviewed: The patient's medical and social history Their use of alcohol, tobacco or illicit drugs Their current medications and supplements The patient's functional ability including ADLs,fall risks, home safety risks, cognitive, and hearing and visual impairment Diet and physical activities Evidence for depression or mood disorders  The patient's weight, height, BMI, and visual acuity have been recorded in the chart.  I have made referrals, counseling, and provided education to the patient based on review of the above and I have provided the patient with a written personalized care plan for preventive services.     Garnet Sierras, NP   05/10/2019

## 2019-05-10 ENCOUNTER — Other Ambulatory Visit: Payer: Self-pay

## 2019-05-10 ENCOUNTER — Ambulatory Visit (INDEPENDENT_AMBULATORY_CARE_PROVIDER_SITE_OTHER): Payer: Medicare Other | Admitting: Adult Health Nurse Practitioner

## 2019-05-10 ENCOUNTER — Encounter: Payer: Self-pay | Admitting: Adult Health Nurse Practitioner

## 2019-05-10 VITALS — BP 124/86 | HR 67 | Temp 96.6°F | Wt 205.0 lb

## 2019-05-10 DIAGNOSIS — Z01818 Encounter for other preprocedural examination: Secondary | ICD-10-CM | POA: Diagnosis not present

## 2019-05-10 DIAGNOSIS — Z0001 Encounter for general adult medical examination with abnormal findings: Secondary | ICD-10-CM

## 2019-05-10 DIAGNOSIS — R0989 Other specified symptoms and signs involving the circulatory and respiratory systems: Secondary | ICD-10-CM | POA: Diagnosis not present

## 2019-05-10 DIAGNOSIS — N401 Enlarged prostate with lower urinary tract symptoms: Secondary | ICD-10-CM | POA: Diagnosis not present

## 2019-05-10 DIAGNOSIS — E559 Vitamin D deficiency, unspecified: Secondary | ICD-10-CM

## 2019-05-10 DIAGNOSIS — R7309 Other abnormal glucose: Secondary | ICD-10-CM

## 2019-05-10 DIAGNOSIS — Z79899 Other long term (current) drug therapy: Secondary | ICD-10-CM | POA: Diagnosis not present

## 2019-05-10 DIAGNOSIS — R6889 Other general symptoms and signs: Secondary | ICD-10-CM | POA: Diagnosis not present

## 2019-05-10 DIAGNOSIS — R58 Hemorrhage, not elsewhere classified: Secondary | ICD-10-CM | POA: Diagnosis not present

## 2019-05-10 DIAGNOSIS — Z0000 Encounter for general adult medical examination without abnormal findings: Secondary | ICD-10-CM

## 2019-05-10 DIAGNOSIS — N138 Other obstructive and reflux uropathy: Secondary | ICD-10-CM | POA: Diagnosis not present

## 2019-05-10 DIAGNOSIS — E782 Mixed hyperlipidemia: Secondary | ICD-10-CM

## 2019-05-10 DIAGNOSIS — Z6827 Body mass index (BMI) 27.0-27.9, adult: Secondary | ICD-10-CM

## 2019-05-10 NOTE — Patient Instructions (Addendum)
   We will contact you via MyChart in 1-3 days with lab results.  We will fax lab results to [facility] as requested.  We will be in touch after your surgery for follow up.  Please let us know if you need any further assistance and best wishes.   Parker Humphrey , Thank you for taking time to come for your Medicare Wellness Visit. I appreciate your ongoing commitment to your health goals. Please review the following plan we discussed and let me know if I can assist you in the future.   These are the goals we discussed: Goals    . Exercise 150 min/wk Moderate Activity     15-20 min daily of brisk walking for cardiovascular health       This is a list of the screening recommended for you and due dates:  Health Maintenance  Topic Date Due  . Flu Shot  11/13/2018  . Tetanus Vaccine  08/01/2019  . Cologuard (Stool DNA test)  12/14/2020  .  Hepatitis C: One time screening is recommended by Center for Disease Control  (CDC) for  adults born from [birth year] through 1963.   Completed  . Pneumonia vaccines  Completed

## 2019-05-11 LAB — COMPLETE METABOLIC PANEL WITH GFR
AG Ratio: 2 (calc) (ref 1.0–2.5)
ALT: 35 U/L (ref 9–46)
AST: 26 U/L (ref 10–35)
Albumin: 4.3 g/dL (ref 3.6–5.1)
Alkaline phosphatase (APISO): 46 U/L (ref 35–144)
BUN: 20 mg/dL (ref 7–25)
CO2: 28 mmol/L (ref 20–32)
Calcium: 9.4 mg/dL (ref 8.6–10.3)
Chloride: 105 mmol/L (ref 98–110)
Creat: 0.84 mg/dL (ref 0.70–1.25)
GFR, Est African American: 106 mL/min/{1.73_m2} (ref >=60)
GFR, Est Non African American: 91 mL/min/{1.73_m2} (ref >=60)
Globulin: 2.1 g/dL (calc) (ref 1.9–3.7)
Glucose, Bld: 109 mg/dL — ABNORMAL HIGH (ref 65–99)
Potassium: 4.4 mmol/L (ref 3.5–5.3)
Sodium: 140 mmol/L (ref 135–146)
Total Bilirubin: 0.7 mg/dL (ref 0.2–1.2)
Total Protein: 6.4 g/dL (ref 6.1–8.1)

## 2019-05-11 LAB — CBC WITH DIFFERENTIAL/PLATELET
Absolute Monocytes: 440 cells/uL (ref 200–950)
Basophils Absolute: 62 cells/uL (ref 0–200)
Basophils Relative: 1 %
Eosinophils Absolute: 223 cells/uL (ref 15–500)
Eosinophils Relative: 3.6 %
HCT: 45.7 % (ref 38.5–50.0)
Hemoglobin: 15.2 g/dL (ref 13.2–17.1)
Lymphs Abs: 1414 cells/uL (ref 850–3900)
MCH: 29 pg (ref 27.0–33.0)
MCHC: 33.3 g/dL (ref 32.0–36.0)
MCV: 87 fL (ref 80.0–100.0)
MPV: 10.6 fL (ref 7.5–12.5)
Monocytes Relative: 7.1 %
Neutro Abs: 4061 cells/uL (ref 1500–7800)
Neutrophils Relative %: 65.5 %
Platelets: 258 10*3/uL (ref 140–400)
RBC: 5.25 10*6/uL (ref 4.20–5.80)
RDW: 12.4 % (ref 11.0–15.0)
Total Lymphocyte: 22.8 %
WBC: 6.2 10*3/uL (ref 3.8–10.8)

## 2019-05-11 LAB — LIPID PANEL
Cholesterol: 186 mg/dL (ref <200)
HDL: 42 mg/dL (ref >=40)
LDL Cholesterol (Calc): 116 mg/dL (calc) — ABNORMAL HIGH
Non-HDL Cholesterol (Calc): 144 mg/dL (calc) — ABNORMAL HIGH (ref <130)
Total CHOL/HDL Ratio: 4.4 (calc) (ref <5.0)
Triglycerides: 167 mg/dL — ABNORMAL HIGH (ref <150)

## 2019-05-11 LAB — PROTIME-INR
INR: 1
Prothrombin Time: 10.3 s (ref 9.0–11.5)

## 2019-05-11 LAB — MAGNESIUM: Magnesium: 2 mg/dL (ref 1.5–2.5)

## 2019-05-16 ENCOUNTER — Ambulatory Visit: Payer: Self-pay | Admitting: Adult Health

## 2019-05-16 HISTORY — PX: TOTAL KNEE ARTHROPLASTY: SHX125

## 2019-05-23 ENCOUNTER — Ambulatory Visit: Admit: 2019-05-23 | Payer: BLUE CROSS/BLUE SHIELD | Admitting: Orthopedic Surgery

## 2019-05-23 SURGERY — ARTHROPLASTY, KNEE, TOTAL
Anesthesia: Choice | Site: Knee | Laterality: Left

## 2019-05-25 DIAGNOSIS — M1712 Unilateral primary osteoarthritis, left knee: Secondary | ICD-10-CM | POA: Diagnosis not present

## 2019-05-25 DIAGNOSIS — G8918 Other acute postprocedural pain: Secondary | ICD-10-CM | POA: Diagnosis not present

## 2019-05-30 DIAGNOSIS — M25562 Pain in left knee: Secondary | ICD-10-CM | POA: Diagnosis not present

## 2019-06-01 DIAGNOSIS — M25562 Pain in left knee: Secondary | ICD-10-CM | POA: Diagnosis not present

## 2019-06-03 DIAGNOSIS — M25562 Pain in left knee: Secondary | ICD-10-CM | POA: Diagnosis not present

## 2019-06-06 DIAGNOSIS — M25562 Pain in left knee: Secondary | ICD-10-CM | POA: Diagnosis not present

## 2019-06-08 DIAGNOSIS — M25562 Pain in left knee: Secondary | ICD-10-CM | POA: Diagnosis not present

## 2019-06-10 DIAGNOSIS — M25562 Pain in left knee: Secondary | ICD-10-CM | POA: Diagnosis not present

## 2019-06-15 DIAGNOSIS — M25562 Pain in left knee: Secondary | ICD-10-CM | POA: Diagnosis not present

## 2019-06-17 DIAGNOSIS — M25562 Pain in left knee: Secondary | ICD-10-CM | POA: Diagnosis not present

## 2019-06-22 DIAGNOSIS — M25562 Pain in left knee: Secondary | ICD-10-CM | POA: Diagnosis not present

## 2019-06-24 DIAGNOSIS — M25562 Pain in left knee: Secondary | ICD-10-CM | POA: Diagnosis not present

## 2019-06-27 DIAGNOSIS — M25562 Pain in left knee: Secondary | ICD-10-CM | POA: Diagnosis not present

## 2019-06-28 DIAGNOSIS — Z96652 Presence of left artificial knee joint: Secondary | ICD-10-CM | POA: Diagnosis not present

## 2019-06-28 DIAGNOSIS — Z471 Aftercare following joint replacement surgery: Secondary | ICD-10-CM | POA: Diagnosis not present

## 2019-11-21 ENCOUNTER — Encounter: Payer: Medicare Other | Admitting: Internal Medicine

## 2019-12-01 DIAGNOSIS — H903 Sensorineural hearing loss, bilateral: Secondary | ICD-10-CM | POA: Diagnosis not present

## 2020-01-10 NOTE — Progress Notes (Signed)
Comprehensive Evaluation & Examination     This very nice 69 y.o.   MWM  presents for a  comprehensive evaluation and management of multiple medical co-morbidities.  Patient has been followed for HTN, HLD, Prediabetes and Vitamin D Deficiency. I n March 2021 , patient underwent Lt TKR by Dr Elmyra Ricks.     Patient has been followed expectantly since 2002 for labile HTN . Patient's BP has been controlled at home.  Today's BP is at goal - 132/86. Patient denies any cardiac symptoms as chest pain, palpitations, shortness of breath, dizziness or ankle swelling.     Patient's hyperlipidemia is not controlled with diet and Ezetimibe.  Patient has hx/o Statin myalgias and has been reluctant to retry statins. Patient denies myalgias or other medication SE's. Last lipids were not at goal:  Lab Results  Component Value Date   CHOL 186 05/10/2019   HDL 42 05/10/2019   LDLCALC 116 (H) 05/10/2019   TRIG 167 (H) 05/10/2019   CHOLHDL 4.4 05/10/2019       Patient has hx/o prediabetes (A1c 6.1% /2012 and 6.0% /2016) and patient denies reactive hypoglycemic symptoms, visual blurring, diabetic polys or paresthesias. Last A1c was not at goal:  Lab Results  Component Value Date   HGBA1C 6.0 (H) 12/27/2018        Finally, patient has history of Vitamin D Deficiency ("37" /2008) and last vitamin D was at goal:  Lab Results  Component Value Date   VD25OH 69 12/27/2018    Current Outpatient Medications on File Prior to Visit  Medication Sig  . aspirin 81 MG tablet Take 81 mg by mouth daily.  . Cholecalciferol (VITAMIN D PO) Take 2,000 Units by mouth daily.   . Cinnamon 500 MG TABS Take by mouth 2 (two) times daily.  Marland Kitchen ezetimibe (ZETIA) 10 MG tablet Take 1 tablet Daily for Cholesterol  . TURMERIC PO Take 1,000 mg by mouth 2 (two) times daily.  . Zinc 50 MG TABS Take by mouth.    Allergies  Allergen Reactions  . Pravastatin   . Red Yeast Rice [Cholestin] Rash   Past Medical History:  Diagnosis  Date  . Diabetes mellitus without complication ([place]) 06/3005   A1c 6.1% preDiabetes  . H/O vasectomy 49  . Hyperlipidemia 2005  . Hypertension 2009   labile-monitoring expectantly  . Hypogonadism male Oct 2014   Testosterone = 255 (Patient declined treatment)  . Vitamin D deficiency 2008   Vit D = 37   Health Maintenance  Topic Date Due  . COVID-19 Vaccine (1) Never done  . TETANUS/TDAP  08/01/2019  . INFLUENZA VACCINE  11/13/2019  . Fecal DNA (Cologuard)  12/14/2020  . Hepatitis C Screening  Completed  . PNA vac Low Risk Adult  Completed   Immunization History  Administered Date(s) Administered  . DTaP 04/14/2009  . Influenza Split 01/12/2013  . PPD Test 09/14/2013, 09/19/2014, 10/02/2015, 11/04/2017  . Pneumococcal Conjugate-13 05/12/2018  . Pneumococcal Polysaccharide-23 04/14/1997  . Td 04/14/1997  . Tdap 07/31/2009   Last Colon - 09/23/2004 - Dr Sharlett Iles - recc 10 year f/u.  Cologard - 12/27/2018 - Negative - 3 year f/u due Sept 2023  Past Surgical History:  Procedure Laterality Date  . COLONOSCOPY N/A 2006  . INGUINAL HERNIA REPAIR Bilateral 2009  . INGUINAL HERNIA REPAIR Left 1997  . INGUINAL HERNIA REPAIR Left 1998  . KNEE ARTHROSCOPY Left 2010  . VASECTOMY Bilateral 1987   Family History  Problem Relation Age of  Onset  . Cancer Father   . Heart disease Father   . Diabetes Brother   . Hyperlipidemia Brother    Social History   Socioeconomic History  . Marital status: Married x 41 years     Spouse name: Katrina  . Number of children: 2  sons  Occupational History  . Retired [employer]   Tobacco Use  . Smoking status: Former Smoker    Years: 10.00    Quit date: 09/15/1991    Years since quitting: 28.3  . Smokeless tobacco: Never Used  Substance and Sexual Activity  . Alcohol use: No  . Drug use: No  . Sexual activity: Yes     ROS Constitutional: Denies fever, chills, weight loss/gain, headaches, insomnia,  night sweats or change in  appetite. Does c/o fatigue. Eyes: Denies redness, blurred vision, diplopia, discharge, itchy or watery eyes.  ENT: Denies discharge, congestion, post nasal drip, epistaxis, sore throat, earache, hearing loss, dental pain, Tinnitus, Vertigo, Sinus pain or snoring.  Cardio: Denies chest pain, palpitations, irregular heartbeat, syncope, dyspnea, diaphoresis, orthopnea, PND, claudication or edema Respiratory: denies cough, dyspnea, DOE, pleurisy, hoarseness, laryngitis or wheezing.  Gastrointestinal: Denies dysphagia, heartburn, reflux, water brash, pain, cramps, nausea, vomiting, bloating, diarrhea, constipation, hematemesis, melena, hematochezia, jaundice or hemorrhoids Genitourinary: Denies dysuria, frequency, urgency, nocturia, hesitancy, discharge, hematuria or flank pain Musculoskeletal: Denies arthralgia, myalgia, stiffness, Jt. Swelling, pain, limp or strain/sprain. Denies Falls. Skin: Denies puritis, rash, hives, warts, acne, eczema or change in skin lesion Neuro: No weakness, tremor, incoordination, spasms, paresthesia or pain Psychiatric: Denies confusion, memory loss or sensory loss. Denies Depression. Endocrine: Denies change in weight, skin, hair change, nocturia, and paresthesia, diabetic polys, visual blurring or hyper / hypo glycemic episodes.  Heme/Lymph: No excessive bleeding, bruising or enlarged lymph nodes.  Physical Exam  BP 132/86   Pulse 83   Temp (!) 97.5 F (36.4 C)   Resp 16   Ht 6\' 1"  (1.854 m)   Wt 197 lb 6.4 oz (89.5 kg)   SpO2 95%   BMI 26.04 kg/m   General Appearance: Well nourished and well groomed and in no apparent distress.  Eyes: PERRLA, EOMs, conjunctiva no swelling or erythema, normal fundi and vessels. Sinuses: No frontal/maxillary tenderness ENT/Mouth: EACs patent / TMs  nl. Nares clear without erythema, swelling, mucoid exudates. Oral hygiene is good. No erythema, swelling, or exudate. Tongue normal, non-obstructing. Tonsils not swollen or  erythematous. Hearing normal.  Neck: Supple, thyroid not palpable. No bruits, nodes or JVD. Respiratory: Respiratory effort normal.  BS equal and clear bilateral without rales, rhonci, wheezing or stridor. Cardio: Heart sounds are normal with regular rate and rhythm and no murmurs, rubs or gallops. Peripheral pulses are normal and equal bilaterally without edema. No aortic or femoral bruits. Chest: symmetric with normal excursions and percussion.  Abdomen: Soft, with Nl bowel sounds. Nontender, no guarding, rebound, hernias, masses, or organomegaly.  Lymphatics: Non tender without lymphadenopathy.  Musculoskeletal: Full ROM all peripheral extremities, joint stability, 5/5 strength, and normal gait. Skin: Warm and dry without rashes, lesions, cyanosis, clubbing or  ecchymosis.  Neuro: Cranial nerves intact, reflexes equal bilaterally. Normal muscle tone, no cerebellar symptoms. Sensation intact.  Pysch: Alert and oriented X 3 with normal affect, insight and judgment appropriate.   Assessment and Plan  1. Labile hypertension  - EKG 12-Lead - [place], RETROPERITNL ABD,  LTD - Urinalysis, Routine w reflex microscopic - Microalbumin / creatinine urine ratio - CBC with Differential/Platelet - COMPLETE METABOLIC PANEL  WITH GFR - Magnesium - TSH  2. Hyperlipidemia, mixed  - EKG 12-Lead - [place], RETROPERITNL ABD,  LTD - Lipid panel - TSH  3. Abnormal glucose  - EKG 12-Lead - [place], RETROPERITNL ABD,  LTD - Hemoglobin A1c - Insulin, random  4. Vitamin D deficiency  - VITAMIN D 25 Hydroxy   5. Prediabetes  - EKG 12-Lead - [place], RETROPERITNL ABD,  LTD  6. BPH with obstruction/lower urinary tract symptoms  - PSA  7. Screening for colorectal cancer  - POC Hemoccult Bld/Stl  8. Prostate cancer screening  - PSA  9. Screening for ischemic heart disease  - EKG 12-Lead  10. FHx: heart disease  - EKG 12-Lead - [place], RETROPERITNL ABD,  LTD  11. Former smoker  - EKG 12-Lead - [place],  RETROPERITNL ABD,  LTD  12. Screening for AAA (aortic abdominal aneurysm)  - [place], RETROPERITNL ABD,  LTD  13. Medication management  - Urinalysis, Routine w reflex microscopic - Microalbumin / creatinine urine ratio - CBC with Differential/Platelet - COMPLETE METABOLIC PANEL WITH GFR - Magnesium - Lipid panel - TSH - Hemoglobin A1c - Insulin, random - VITAMIN D 25 Hydroxy          Patient was counseled in prudent diet, weight control to achieve/maintain BMI less than 25, BP monitoring, regular exercise and medications as discussed.  Discussed med effects and SE's. Routine screening labs and tests as requested with regular follow-up as recommended. Over 40 minutes of exam, counseling, chart review and high complex critical decision making was performed   Kirtland Bouchard, MD

## 2020-01-11 ENCOUNTER — Ambulatory Visit (INDEPENDENT_AMBULATORY_CARE_PROVIDER_SITE_OTHER): Payer: Medicare Other | Admitting: Internal Medicine

## 2020-01-11 ENCOUNTER — Encounter: Payer: Self-pay | Admitting: Internal Medicine

## 2020-01-11 ENCOUNTER — Other Ambulatory Visit: Payer: Self-pay

## 2020-01-11 VITALS — BP 132/86 | HR 83 | Temp 97.5°F | Resp 16 | Ht 73.0 in | Wt 197.4 lb

## 2020-01-11 DIAGNOSIS — Z79899 Other long term (current) drug therapy: Secondary | ICD-10-CM | POA: Diagnosis not present

## 2020-01-11 DIAGNOSIS — R0989 Other specified symptoms and signs involving the circulatory and respiratory systems: Secondary | ICD-10-CM | POA: Diagnosis not present

## 2020-01-11 DIAGNOSIS — Z87891 Personal history of nicotine dependence: Secondary | ICD-10-CM | POA: Diagnosis not present

## 2020-01-11 DIAGNOSIS — Z8249 Family history of ischemic heart disease and other diseases of the circulatory system: Secondary | ICD-10-CM

## 2020-01-11 DIAGNOSIS — N401 Enlarged prostate with lower urinary tract symptoms: Secondary | ICD-10-CM | POA: Diagnosis not present

## 2020-01-11 DIAGNOSIS — Z125 Encounter for screening for malignant neoplasm of prostate: Secondary | ICD-10-CM | POA: Diagnosis not present

## 2020-01-11 DIAGNOSIS — R7303 Prediabetes: Secondary | ICD-10-CM | POA: Diagnosis not present

## 2020-01-11 DIAGNOSIS — Z23 Encounter for immunization: Secondary | ICD-10-CM | POA: Diagnosis not present

## 2020-01-11 DIAGNOSIS — Z136 Encounter for screening for cardiovascular disorders: Secondary | ICD-10-CM

## 2020-01-11 DIAGNOSIS — G720 Drug-induced myopathy: Secondary | ICD-10-CM | POA: Diagnosis not present

## 2020-01-11 DIAGNOSIS — E782 Mixed hyperlipidemia: Secondary | ICD-10-CM | POA: Diagnosis not present

## 2020-01-11 DIAGNOSIS — Z1211 Encounter for screening for malignant neoplasm of colon: Secondary | ICD-10-CM

## 2020-01-11 DIAGNOSIS — N138 Other obstructive and reflux uropathy: Secondary | ICD-10-CM | POA: Diagnosis not present

## 2020-01-11 DIAGNOSIS — R7309 Other abnormal glucose: Secondary | ICD-10-CM

## 2020-01-11 DIAGNOSIS — E559 Vitamin D deficiency, unspecified: Secondary | ICD-10-CM | POA: Diagnosis not present

## 2020-01-11 NOTE — Patient Instructions (Signed)

## 2020-01-12 LAB — URINALYSIS, ROUTINE W REFLEX MICROSCOPIC
Bilirubin Urine: NEGATIVE
Glucose, UA: NEGATIVE
Hgb urine dipstick: NEGATIVE
Leukocytes,Ua: NEGATIVE
Nitrite: NEGATIVE
Protein, ur: NEGATIVE
Specific Gravity, Urine: 1.027 (ref 1.001–1.03)
pH: 6.5 (ref 5.0–8.0)

## 2020-01-12 LAB — COMPLETE METABOLIC PANEL WITH GFR
AG Ratio: 1.6 (calc) (ref 1.0–2.5)
ALT: 26 U/L (ref 9–46)
AST: 19 U/L (ref 10–35)
Albumin: 4.4 g/dL (ref 3.6–5.1)
Alkaline phosphatase (APISO): 69 U/L (ref 35–144)
BUN: 18 mg/dL (ref 7–25)
CO2: 28 mmol/L (ref 20–32)
Calcium: 9.7 mg/dL (ref 8.6–10.3)
Chloride: 101 mmol/L (ref 98–110)
Creat: 0.87 mg/dL (ref 0.70–1.25)
GFR, Est African American: 104 mL/min/{1.73_m2} (ref >=60)
GFR, Est Non African American: 90 mL/min/{1.73_m2} (ref >=60)
Globulin: 2.7 g/dL (calc) (ref 1.9–3.7)
Glucose, Bld: 103 mg/dL — ABNORMAL HIGH (ref 65–99)
Potassium: 4.3 mmol/L (ref 3.5–5.3)
Sodium: 139 mmol/L (ref 135–146)
Total Bilirubin: 0.8 mg/dL (ref 0.2–1.2)
Total Protein: 7.1 g/dL (ref 6.1–8.1)

## 2020-01-12 LAB — CBC WITH DIFFERENTIAL/PLATELET
Absolute Monocytes: 681 cells/uL (ref 200–950)
Basophils Absolute: 50 cells/uL (ref 0–200)
Basophils Relative: 0.6 %
Eosinophils Absolute: 282 cells/uL (ref 15–500)
Eosinophils Relative: 3.4 %
HCT: 48.2 % (ref 38.5–50.0)
Hemoglobin: 15.6 g/dL (ref 13.2–17.1)
Lymphs Abs: 1237 cells/uL (ref 850–3900)
MCH: 29 pg (ref 27.0–33.0)
MCHC: 32.4 g/dL (ref 32.0–36.0)
MCV: 89.6 fL (ref 80.0–100.0)
MPV: 11.2 fL (ref 7.5–12.5)
Monocytes Relative: 8.2 %
Neutro Abs: 6051 cells/uL (ref 1500–7800)
Neutrophils Relative %: 72.9 %
Platelets: 258 10*3/uL (ref 140–400)
RBC: 5.38 10*6/uL (ref 4.20–5.80)
RDW: 12.2 % (ref 11.0–15.0)
Total Lymphocyte: 14.9 %
WBC: 8.3 10*3/uL (ref 3.8–10.8)

## 2020-01-12 LAB — HEMOGLOBIN A1C
Hgb A1c MFr Bld: 6 % of total Hgb — ABNORMAL HIGH (ref <5.7)
Mean Plasma Glucose: 126 (calc)
eAG (mmol/L): 7 (calc)

## 2020-01-12 LAB — LIPID PANEL
Cholesterol: 179 mg/dL (ref <200)
HDL: 41 mg/dL (ref >=40)
LDL Cholesterol (Calc): 108 mg/dL (calc) — ABNORMAL HIGH
Non-HDL Cholesterol (Calc): 138 mg/dL (calc) — ABNORMAL HIGH (ref <130)
Total CHOL/HDL Ratio: 4.4 (calc) (ref <5.0)
Triglycerides: 182 mg/dL — ABNORMAL HIGH (ref <150)

## 2020-01-12 LAB — PSA: PSA: 3.46 ng/mL (ref <=4.0)

## 2020-01-12 LAB — TSH: TSH: 1.63 mIU/L (ref 0.40–4.50)

## 2020-01-12 LAB — MAGNESIUM: Magnesium: 2 mg/dL (ref 1.5–2.5)

## 2020-01-12 LAB — MICROALBUMIN / CREATININE URINE RATIO
Creatinine, Urine: 282 mg/dL (ref 20–320)
Microalb Creat Ratio: 5 mcg/mg creat (ref <30)
Microalb, Ur: 1.5 mg/dL

## 2020-01-12 LAB — INSULIN, RANDOM: Insulin: 9.1 u[IU]/mL

## 2020-01-12 LAB — VITAMIN D 25 HYDROXY (VIT D DEFICIENCY, FRACTURES): Vit D, 25-Hydroxy: 52 ng/mL (ref 30–100)

## 2020-01-12 NOTE — Progress Notes (Signed)
========================================================== - Test results slightly outside the reference range are not unusual. If there is anything important, I will review this with you,  otherwise it is considered normal test values.  If you have further questions,  please do not hesitate to contact me at the office or via My Chart.  ==========================================================  -  PSA slightly up from last year - but still Normal   - Will recheck at your Feb 2022 OV  ==========================================================  -  Total Chol = 179 - Excellent   - Very low risk for Heart Attack  / Stroke =============================================================  -  But the Bad LDL Chol = 108 - still slightly elevated   - work on the [employer]   - Cholesterol only comes from animal sources  - ie. meat, dairy, egg yolks  - Eat all the vegetables you want.  - Avoid meat, especially red meat - Beef AND Pork .  - Avoid cheese & dairy - milk & ice cream.     - Cheese is the most concentrated form of trans-fats which  is the worst thing to clog up our arteries.   - Veggie cheese is OK which can be found in the fresh  produce section at [employer] or [employer] or [employer] ==========================================================  -  Also, Triglycerides still up a little   Triglycerides (   182   ) or fats in blood are too high  (goal is less than 150)    - Recommend avoid fried & greasy foods,  sweets / candy,   - Avoid white rice  (brown or wild rice or Quinoa is OK),   - Avoid white potatoes  (sweet potatoes are OK)   - Avoid anything made from white flour  - bagels, doughnuts, rolls, buns, biscuits, white and   wheat breads, pizza crust and traditional  pasta made of white flour & egg white  - (vegetarian pasta or spinach or wheat pasta is OK).    - Multi-grain bread is OK - like multi-grain flat bread or  sandwich thins.   -  Avoid alcohol in excess.   - Exercise is also important. ==========================================================  -  A1c = 6.0% Blood sugar and A1c are STILL elevated in the  borderline and early or pre-diabetes range which has the same   300% increased risk for heart attack, stroke, cancer and   alzheimer- type vascular dementia as full blown diabetes.   But the good news is that diet, exercise with  weight loss can cure the early diabetes at this point. ==========================================================  -  Vitamin D has dropped from 69  (good level) to now 52 ( low)  - Vitamin D goal is between 70-100.   - Please INCREASE your Vitamin D from 2,000 u up to  5,000 to 6,000 units day  - It is very important as a natural anti-inflammatory and helping the  immune system protect against viral infections, like the Covid-19    helping hair, skin, and nails, as well as reducing stroke and  heart attack risk.   - It helps your bones and helps with mood.  - It also decreases numerous cancer risks so please take it as directed.   - Low Vit D is associated with a 200-300% higher risk for CANCER   and 200-300% higher risk for HEART   ATTACK  &  STROKE.    - It is also associated with higher death rate at younger ages,   autoimmune diseases  like Rheumatoid arthritis, Lupus,  Multiple Sclerosis.     - Also many other serious conditions, like depression, Alzheimer's  Dementia, infertility, muscle aches, fatigue, fibromyalgia  - just to name a few.  ==========================================================  - All Else - CBC - Kidneys - Electrolytes - Liver - Magnesium & Thyroid    - all  Normal / OK ====================================================

## 2020-01-14 DIAGNOSIS — Z20822 Contact with and (suspected) exposure to covid-19: Secondary | ICD-10-CM | POA: Diagnosis not present

## 2020-02-01 ENCOUNTER — Other Ambulatory Visit: Payer: Self-pay

## 2020-02-01 DIAGNOSIS — Z1211 Encounter for screening for malignant neoplasm of colon: Secondary | ICD-10-CM | POA: Diagnosis not present

## 2020-02-01 DIAGNOSIS — Z1212 Encounter for screening for malignant neoplasm of rectum: Secondary | ICD-10-CM | POA: Diagnosis not present

## 2020-02-01 LAB — POC HEMOCCULT BLD/STL (HOME/3-CARD/SCREEN)
Card #2 Fecal Occult Blod, POC: NEGATIVE
Card #3 Fecal Occult Blood, POC: NEGATIVE
Fecal Occult Blood, POC: NEGATIVE

## 2020-03-19 ENCOUNTER — Other Ambulatory Visit: Payer: Self-pay | Admitting: Internal Medicine

## 2020-05-15 ENCOUNTER — Other Ambulatory Visit: Payer: Self-pay

## 2020-05-15 ENCOUNTER — Ambulatory Visit (INDEPENDENT_AMBULATORY_CARE_PROVIDER_SITE_OTHER): Payer: Medicare Other | Admitting: Adult Health Nurse Practitioner

## 2020-05-15 ENCOUNTER — Encounter: Payer: Self-pay | Admitting: Adult Health Nurse Practitioner

## 2020-05-15 VITALS — BP 120/80 | HR 82 | Temp 97.6°F | Ht 72.0 in | Wt 201.0 lb

## 2020-05-15 DIAGNOSIS — R0989 Other specified symptoms and signs involving the circulatory and respiratory systems: Secondary | ICD-10-CM

## 2020-05-15 DIAGNOSIS — E782 Mixed hyperlipidemia: Secondary | ICD-10-CM

## 2020-05-15 DIAGNOSIS — E559 Vitamin D deficiency, unspecified: Secondary | ICD-10-CM | POA: Diagnosis not present

## 2020-05-15 DIAGNOSIS — I10 Essential (primary) hypertension: Secondary | ICD-10-CM

## 2020-05-15 DIAGNOSIS — R7309 Other abnormal glucose: Secondary | ICD-10-CM | POA: Diagnosis not present

## 2020-05-15 DIAGNOSIS — N401 Enlarged prostate with lower urinary tract symptoms: Secondary | ICD-10-CM | POA: Diagnosis not present

## 2020-05-15 DIAGNOSIS — Z0000 Encounter for general adult medical examination without abnormal findings: Secondary | ICD-10-CM

## 2020-05-15 DIAGNOSIS — T466X5A Adverse effect of antihyperlipidemic and antiarteriosclerotic drugs, initial encounter: Secondary | ICD-10-CM

## 2020-05-15 DIAGNOSIS — R972 Elevated prostate specific antigen [PSA]: Secondary | ICD-10-CM | POA: Diagnosis not present

## 2020-05-15 DIAGNOSIS — G720 Drug-induced myopathy: Secondary | ICD-10-CM | POA: Diagnosis not present

## 2020-05-15 DIAGNOSIS — Z6827 Body mass index (BMI) 27.0-27.9, adult: Secondary | ICD-10-CM

## 2020-05-15 DIAGNOSIS — N138 Other obstructive and reflux uropathy: Secondary | ICD-10-CM

## 2020-05-15 NOTE — Progress Notes (Signed)
MEDICARE ANNUAL WELLNESS VISIT AND FOLLOW UP  Assessment:   Parker Humphrey was seen today for follow-up.  Diagnoses and all orders for this visit:  Encounter Medicare annual wellness visit, second Yearly  Laible hypertension Controlled no medications Monitor blood pressure at home; call if consistently over 130/80 Continue DASH diet.   Reminder to go to the ER if any CP, SOB, nausea, dizziness, severe HA, changes vision/speech, left arm numbness and tingling and jaw pain.  Hyperlipidemia, mixed Continue medications: zetia 10 mg -  LDL goal <100; patient prefers to work on lifestyle, declines switch to statin Continue low cholesterol diet and exercise.  Check lipid panel.  -     Lipid panel -     TSH  Vitamin D deficiency At goal at recent check; continue to recommend supplementation for goal of 70-100 Defer vitamin D level  Abnormal glucose Discussed disease and risks Discussed diet/exercise, weight management  Well controlled last A1c 6   BMI 27.0-27.9,adult Continue to recommend diet heavy in fruits and veggies and low in animal meats, cheeses, and dairy products, appropriate calorie intake Discuss exercise recommendations routinely Continue to monitor weight at each visit  BPH with LUTS -PSA Doing well at this time Continue to monitor  Medication management Continued  Over 30 minutes of face to face interview, exam, counseling, chart review, and critical decision making was performed  Future Appointments  Date Time Provider [place]  01/18/2021 11:00 AM Parker Pinto, MD [facility] None  05/15/2021  9:00 AM Parker Sierras, NP [facility] None     Plan:   During the course of the visit the patient was educated and counseled about appropriate screening and preventive services including:    Pneumococcal vaccine   Influenza vaccine  Prevnar 13  Td vaccine  Screening electrocardiogram  Colorectal cancer screening  Diabetes screening  Glaucoma  screening  Nutrition counseling    Subjective:  Parker Nevel. is a 70 y.o. male who presents for Medicare Annual Wellness Visit and 3 month follow up for HTN, HLD, BPH, abnormal glucose, weight and vitamin D Def.  He reports overall he is doing well.  He does not hav any health or medication concerns today.  He is scheduled for outpatient knee surgery on 05/22/18 by Dr Parker Humphrey, [facility].  This will done outpatient.  Labs have been requested per their office and will be drawn today.  He has been given presurgical instructions regarding his medications as well as expectations post surgery.  Reports his wife had same surgery one year ago. Labs: 12/27/18           A1c 6.0                      B/P controlled           CRE 0.90     /   GFR NONAA 89             BMI is Body mass index is 27.26 kg/m., he has been working on diet and exercise. Wt Readings from Last 3 Encounters:  05/15/20 201 lb (91.2 kg)  01/11/20 197 lb 6.4 oz (89.5 kg)  05/10/19 205 lb (93 kg)    His blood pressure has been controlled at home, today their BP is BP: 120/80 He does not workout related to orthopedic constraints with his knee. He denies chest pain, shortness of breath, dizziness.   He is on cholesterol medication (zeita 10 mg) and denies myalgias. His cholesterol  is not at goal. The cholesterol last visit was:   Lab Results  Component Value Date   CHOL 184 05/15/2020   HDL 38 (L) 05/15/2020   LDLCALC 112 (H) 05/15/2020   TRIG 225 (H) 05/15/2020   CHOLHDL 4.8 05/15/2020   He has been working on diet and exercise for prediabetes, and denies increased appetite, nausea, paresthesia of the feet, polydipsia, polyuria, visual disturbances and vomiting. Last A1C in the office was:  Lab Results  Component Value Date   HGBA1C 6.0 (H) 01/11/2020   Last GFR Lab Results  Component Value Date   GFRNONAA 88 05/15/2020   Patient is on Vitamin D supplement.   Lab Results  Component Value Date   VD25OH 52  01/11/2020      Medication Review:   Current Outpatient Medications (Cardiovascular):  .  ezetimibe (ZETIA) 10 MG tablet, TAKE 1 TABLET BY MOUTH EVERY DAY FOR CHOLESTEROL   Current Outpatient Medications (Analgesics):  .  aspirin 81 MG tablet, Take 81 mg by mouth daily.   Current Outpatient Medications (Other):  Marland Kitchen  Cholecalciferol (VITAMIN D PO), Take 2,000 Units by mouth daily.  .  Cinnamon 500 MG TABS, Take by mouth 2 (two) times daily. .  TURMERIC PO, Take 1,000 mg by mouth 2 (two) times daily. .  Zinc 50 MG TABS, Take by mouth.  Allergies: Allergies  Allergen Reactions  . Pravastatin   . Red Yeast Rice [Cholestin] Rash    Current Problems (verified) has Essential hypertension; Hyperlipidemia; Vitamin D deficiency; Prediabetes; and Medication management on their problem list.  Screening Tests Immunization History  Administered Date(s) Administered  . DTaP 04/14/2009  . Influenza Split 01/12/2013  . PPD Test 09/14/2013, 09/19/2014, 10/02/2015, 11/04/2017  . Pneumococcal Conjugate-13 05/12/2018  . Pneumococcal Polysaccharide-23 04/14/1997  . Pneumococcal-Unspecified 01/13/2019  . Td 04/14/1997, 01/11/2020  . Tdap 07/31/2009    Preventative care: Last colonoscopy: 2006 Last cologuard: 12/2017  Due 2023 Prior vaccinations: TD or Tdap: 12/2019  Influenza: Declines  Pneumococcal: 1999 Prevnar13: 04/2018 Shingles/Zostavax: declines  Names of Other Physician/Practitioners you currently use: 1. [facility] here for primary care 2. [facility], Dr Parker Humphrey, last visit 2021,  3. Dentist, last visit, 2020, goes q5m  Patient Care Team: Parker Pinto, MD as PCP - General (Internal Medicine)  Surgical: He  has a past surgical history that includes Knee arthroscopy (Left, 2010); Vasectomy (Bilateral, 1987); Inguinal hernia repair (Bilateral, 2009); Inguinal hernia repair (Left, 1997); Inguinal hernia repair (Left, 1998); and  Colonoscopy (N/A, 2006). Family His family history includes Cancer in his father; Diabetes in his brother; Heart disease in his father; Hyperlipidemia in his brother. Social history  He reports that he quit smoking about 28 years ago. He quit after 10.00 years of use. He has never used smokeless tobacco. He reports that he does not drink alcohol and does not use drugs.  MEDICARE WELLNESS OBJECTIVES: Physical activity: Current Exercise Habits: Home exercise routine, Type of exercise: walking, Time (Minutes): 40, Frequency (Times/Week): 5, Weekly Exercise (Minutes/Week): 200, Intensity: Moderate, Exercise limited by: None identified Cardiac risk factors: Cardiac Risk Factors include: advanced age (>58men, >74 women);dyslipidemia;male gender Depression/mood screen:   Depression screen [facility] 2/9 05/15/2020  Decreased Interest 0  Down, Depressed, Hopeless 0  PHQ - 2 Score 0    ADLs:  In your present state of health, do you have any difficulty performing the following activities: 05/15/2020 01/11/2020  Hearing? N N  Vision? N N  Difficulty concentrating or  making decisions? N N  Walking or climbing stairs? N N  Dressing or bathing? N N  Doing errands, shopping? N N  Preparing Food and eating ? N -  Using the Toilet? N -  In the past six months, have you accidently leaked urine? N -  Do you have problems with loss of bowel control? N -  Managing your Medications? N -  Managing your Finances? N -  Housekeeping or managing your Housekeeping? N -  Some recent data might be hidden     Cognitive Testing  Alert? Yes  Normal Appearance?Yes  Oriented to person? Yes  Place? Yes   Time? Yes  Recall of three objects?  Yes  Can perform simple calculations? Yes  Displays appropriate judgment?Yes  Can read the correct time from a watch face?Yes  EOL planning: Does Patient Have a Medical Advance Directive?: Yes Type of Advance Directive: [place] will [employer] in Chart?: No - copy requested   Objective:   Today's Vitals   05/15/20 0905  BP: 120/80  Pulse: 82  Temp: 97.6 F (36.4 C)  SpO2: 96%  Weight: 201 lb (91.2 kg)  Height: 6' (1.829 m)   Body mass index is 27.26 kg/m.  General appearance: alert, no distress, WD/WN, male HEENT: normocephalic, sclerae anicteric, TMs pearly, nares patent, no discharge or erythema, pharynx normal Oral cavity: MMM, no lesions Neck: supple, no lymphadenopathy, no thyromegaly, no masses Heart: RRR, normal S1, S2, no murmurs Lungs: CTA bilaterally, no wheezes, rhonchi, or rales Abdomen: +bs, soft, non tender, non distended, no masses, no hepatomegaly, no splenomegaly Musculoskeletal: nontender, no swelling, no obvious deformity Extremities: no edema, no cyanosis, no clubbing Pulses: 2+ symmetric, upper and lower extremities, normal cap refill Neurological: alert, oriented x 3, CN2-12 intact, strength normal upper extremities and lower extremities, sensation normal throughout, DTRs 2+ throughout, no cerebellar signs, gait normal Psychiatric: normal affect, behavior normal, pleasant   Medicare Attestation I have personally reviewed: The patient's medical and social history Their use of alcohol, tobacco or illicit drugs Their current medications and supplements The patient's functional ability including ADLs,fall risks, home safety risks, cognitive, and hearing and visual impairment Diet and physical activities  Evidence for depression or mood disorders  The patient's weight, height, BMI, and visual acuity have been recorded in the chart.  I have made referrals, counseling, and provided education to the patient based on review of the above and I have provided the patient with a written personalized care plan for preventive services.     Parker Sierras, NP   05/15/2020

## 2020-05-16 LAB — COMPLETE METABOLIC PANEL WITH GFR
AG Ratio: 1.4 (calc) (ref 1.0–2.5)
ALT: 25 U/L (ref 9–46)
AST: 18 U/L (ref 10–35)
Albumin: 3.9 g/dL (ref 3.6–5.1)
Alkaline phosphatase (APISO): 56 U/L (ref 35–144)
BUN: 19 mg/dL (ref 7–25)
CO2: 31 mmol/L (ref 20–32)
Calcium: 9.5 mg/dL (ref 8.6–10.3)
Chloride: 103 mmol/L (ref 98–110)
Creat: 0.89 mg/dL (ref 0.70–1.25)
GFR, Est African American: 103 mL/min/{1.73_m2} (ref >=60)
GFR, Est Non African American: 88 mL/min/{1.73_m2} (ref >=60)
Globulin: 2.7 g/dL (calc) (ref 1.9–3.7)
Glucose, Bld: 97 mg/dL (ref 65–99)
Potassium: 4.4 mmol/L (ref 3.5–5.3)
Sodium: 140 mmol/L (ref 135–146)
Total Bilirubin: 0.7 mg/dL (ref 0.2–1.2)
Total Protein: 6.6 g/dL (ref 6.1–8.1)

## 2020-05-16 LAB — CBC WITH DIFFERENTIAL/PLATELET
Absolute Monocytes: 520 cells/uL (ref 200–950)
Basophils Absolute: 59 cells/uL (ref 0–200)
Basophils Relative: 0.9 %
Eosinophils Absolute: 189 cells/uL (ref 15–500)
Eosinophils Relative: 2.9 %
HCT: 45.4 % (ref 38.5–50.0)
Hemoglobin: 15.2 g/dL (ref 13.2–17.1)
Lymphs Abs: 1281 cells/uL (ref 850–3900)
MCH: 29.3 pg (ref 27.0–33.0)
MCHC: 33.5 g/dL (ref 32.0–36.0)
MCV: 87.6 fL (ref 80.0–100.0)
MPV: 10.9 fL (ref 7.5–12.5)
Monocytes Relative: 8 %
Neutro Abs: 4453 cells/uL (ref 1500–7800)
Neutrophils Relative %: 68.5 %
Platelets: 270 10*3/uL (ref 140–400)
RBC: 5.18 10*6/uL (ref 4.20–5.80)
RDW: 12.4 % (ref 11.0–15.0)
Total Lymphocyte: 19.7 %
WBC: 6.5 10*3/uL (ref 3.8–10.8)

## 2020-05-16 LAB — LIPID PANEL
Cholesterol: 184 mg/dL (ref <200)
HDL: 38 mg/dL — ABNORMAL LOW (ref >=40)
LDL Cholesterol (Calc): 112 mg/dL (calc) — ABNORMAL HIGH
Non-HDL Cholesterol (Calc): 146 mg/dL (calc) — ABNORMAL HIGH (ref <130)
Total CHOL/HDL Ratio: 4.8 (calc) (ref <5.0)
Triglycerides: 225 mg/dL — ABNORMAL HIGH (ref <150)

## 2020-05-16 LAB — PSA: PSA: 3.21 ng/mL (ref <=4.0)

## 2021-01-18 ENCOUNTER — Encounter: Payer: Medicare Other | Admitting: Internal Medicine

## 2021-03-18 ENCOUNTER — Other Ambulatory Visit: Payer: Self-pay | Admitting: Adult Health Nurse Practitioner

## 2021-03-19 ENCOUNTER — Encounter: Payer: Self-pay | Admitting: Internal Medicine

## 2021-03-19 ENCOUNTER — Other Ambulatory Visit: Payer: Self-pay

## 2021-03-19 ENCOUNTER — Ambulatory Visit (INDEPENDENT_AMBULATORY_CARE_PROVIDER_SITE_OTHER): Payer: Medicare Other | Admitting: Internal Medicine

## 2021-03-19 VITALS — BP 140/88 | HR 56 | Temp 97.9°F | Resp 16 | Ht 72.0 in | Wt 198.4 lb

## 2021-03-19 DIAGNOSIS — N401 Enlarged prostate with lower urinary tract symptoms: Secondary | ICD-10-CM

## 2021-03-19 DIAGNOSIS — E782 Mixed hyperlipidemia: Secondary | ICD-10-CM | POA: Diagnosis not present

## 2021-03-19 DIAGNOSIS — R7303 Prediabetes: Secondary | ICD-10-CM

## 2021-03-19 DIAGNOSIS — Z87891 Personal history of nicotine dependence: Secondary | ICD-10-CM

## 2021-03-19 DIAGNOSIS — G720 Drug-induced myopathy: Secondary | ICD-10-CM | POA: Diagnosis not present

## 2021-03-19 DIAGNOSIS — Z136 Encounter for screening for cardiovascular disorders: Secondary | ICD-10-CM

## 2021-03-19 DIAGNOSIS — N138 Other obstructive and reflux uropathy: Secondary | ICD-10-CM | POA: Diagnosis not present

## 2021-03-19 DIAGNOSIS — E559 Vitamin D deficiency, unspecified: Secondary | ICD-10-CM | POA: Diagnosis not present

## 2021-03-19 DIAGNOSIS — R7309 Other abnormal glucose: Secondary | ICD-10-CM

## 2021-03-19 DIAGNOSIS — Z79899 Other long term (current) drug therapy: Secondary | ICD-10-CM | POA: Diagnosis not present

## 2021-03-19 DIAGNOSIS — Z8249 Family history of ischemic heart disease and other diseases of the circulatory system: Secondary | ICD-10-CM

## 2021-03-19 DIAGNOSIS — Z125 Encounter for screening for malignant neoplasm of prostate: Secondary | ICD-10-CM

## 2021-03-19 DIAGNOSIS — Z1211 Encounter for screening for malignant neoplasm of colon: Secondary | ICD-10-CM

## 2021-03-19 DIAGNOSIS — R0989 Other specified symptoms and signs involving the circulatory and respiratory systems: Secondary | ICD-10-CM | POA: Diagnosis not present

## 2021-03-19 NOTE — Patient Instructions (Signed)

## 2021-03-19 NOTE — Progress Notes (Signed)
Annual  Screening/Preventative Visit  & Comprehensive Evaluation & Examination  Future Appointments  Date Time Provider Department  03/19/2021            CPE  2:00 PM Unk Pinto, MD [facility]  05/15/2021              Wellness  9:00 AM Magda Bernheim, NP [facility]  03/26/2022          CPE   2:00 PM Unk Pinto, MD [facility]            This very nice 70 y.o.male presents for a Screening /Preventative Visit & comprehensive evaluation and management of multiple medical co-morbidities.  Patient has been followed for HTN, HLD, Prediabetes and Vitamin D Deficiency.       Labile HTN predates since  2002. Patient's BP has been controlled at home.  Today's BP was high normal - 140/88. Patient denies any cardiac symptoms as chest pain, palpitations, shortness of breath, dizziness or ankle swelling.       Patient is statin intolerant & his hyperlipidemia is not controlled with diet and Ezetimibe. Patient denies myalgias or other medication SE's. Last lipids were not at goal :  Lab Results  Component Value Date   CHOL 184 05/15/2020   HDL 38 (L) 05/15/2020   LDLCALC 112 (H) 05/15/2020   TRIG 225 (H) 05/15/2020   CHOLHDL 4.8 05/15/2020         Patient has hx/o prediabetes (A1c 6.1% /2012) and patient denies reactive hypoglycemic symptoms, visual blurring, diabetic polys or paresthesias. Last A1c was not at goal :   Lab Results  Component Value Date   HGBA1C 6.0 (H) 01/11/2020          Finally, patient has history of Vitamin D Deficiency of    and last vitamin D was not at goal :   Lab Results  Component Value Date   VD25OH 52 01/11/2020     Current Outpatient Medications on File Prior to Visit  Medication Sig   aspirin 81 MG  Take daily.   VITAMIN D 2,000 Units Take   daily.    Cinnamon 500 MG  Take  2  times daily.   ezetimibe (ZETIA) 10 MG  TAKE 1 TABLET EVERY DAY    TURMERIC 1,000 mg  Take 2 times daily.   Zinc 50 MG  Take daily     Allergies  Allergen  Reactions   Pravastatin    Red Yeast Rice [Cholestin] Rash     Past Medical History:  Diagnosis Date   Diabetes mellitus without complication ([place]) 0/5397   A1c 6.1% preDiabetes   H/O vasectomy 1987   Hyperlipidemia 2005   Hypertension 2009   labile-monitoring expectantly   Hypogonadism male Oct 2014   Testosterone = 255 (Patient declined treatment)   Vitamin D deficiency 2008   Vit D = 37     Health Maintenance  Topic Date Due   COVID-19 Vaccine (1) Never done   Zoster Vaccines- Shingrix (1 of 2) Never done   Pneumonia Vaccine 18+ Years old (3 - PPSV23 if available, else PCV20) 01/13/2020   INFLUENZA VACCINE  11/12/2020   Fecal DNA (Cologuard)  12/14/2020   TETANUS/TDAP  01/10/2030   Hepatitis C Screening  Completed   HPV VACCINES  Aged Out     Immunization History  Administered Date(s) Administered   DTaP 04/14/2009   Influenza Split 01/12/2013   PPD Test 09/14/2013, 09/19/2014, 10/02/2015, 11/04/2017   Pneumococcal Conjugate-13 05/12/2018  Pneumococcal Polysaccharide-23 04/14/1997   Pneumococcal-Unspecified 01/13/2019   Td 04/14/1997, 01/11/2020   Tdap 07/31/2009   Last Colon -  09/23/2004 - Dr Sharlett Iles - nEGATIVE  Cologard - 12/14/2017 - Negative - due 3 year f/u Sept 2022  Past Surgical History:  Procedure Laterality Date   COLONOSCOPY N/A 2006   INGUINAL HERNIA REPAIR Bilateral 2009   INGUINAL HERNIA REPAIR Left 1997   INGUINAL HERNIA REPAIR Left 1998   KNEE ARTHROSCOPY Left 2010   VASECTOMY Bilateral 1987     Family History  Problem Relation Age of Onset   Cancer Father    Heart disease Father    Diabetes Brother    Hyperlipidemia Brother      Social History   Tobacco Use   Smoking status: Former    Years: 10.00    Types: Cigarettes    Quit date: 09/15/1991    Years since quitting: 29.5   Smokeless tobacco: Never  Substance Use Topics   Alcohol use: No   Drug use: No      ROS Constitutional: Denies fever, chills, weight  loss/gain, headaches, insomnia,  night sweats or change in appetite. Does c/o fatigue. Eyes: Denies redness, blurred vision, diplopia, discharge, itchy or watery eyes.  ENT: Denies discharge, congestion, post nasal drip, epistaxis, sore throat, earache, hearing loss, dental pain, Tinnitus, Vertigo, Sinus pain or snoring.  Cardio: Denies chest pain, palpitations, irregular heartbeat, syncope, dyspnea, diaphoresis, orthopnea, PND, claudication or edema Respiratory: denies cough, dyspnea, DOE, pleurisy, hoarseness, laryngitis or wheezing.  Gastrointestinal: Denies dysphagia, heartburn, reflux, water brash, pain, cramps, nausea, vomiting, bloating, diarrhea, constipation, hematemesis, melena, hematochezia, jaundice or hemorrhoids Genitourinary: Denies dysuria, frequency, urgency, nocturia, hesitancy, discharge, hematuria or flank pain Musculoskeletal: Denies arthralgia, myalgia, stiffness, Jt. Swelling, pain, limp or strain/sprain. Denies Falls. Skin: Denies puritis, rash, hives, warts, acne, eczema or change in skin lesion Neuro: No weakness, tremor, incoordination, spasms, paresthesia or pain Psychiatric: Denies confusion, memory loss or sensory loss. Denies Depression. Endocrine: Denies change in weight, skin, hair change, nocturia, and paresthesia, diabetic polys, visual blurring or hyper / hypo glycemic episodes.  Heme/Lymph: No excessive bleeding, bruising or enlarged lymph nodes.   Physical Exam  BP 140/88   Pulse (!) 56   Temp 97.9 F (36.6 C)   Resp 16   Ht 6' (1.829 m)   Wt 198 lb 6.4 oz (90 kg)   SpO2 98%   BMI 26.91 kg/m   General Appearance: Well nourished and well groomed and in no apparent distress.  Eyes: PERRLA, EOMs, conjunctiva no swelling or erythema, normal fundi and vessels. Sinuses: No frontal/maxillary tenderness ENT/Mouth: EACs patent / TMs  nl. Nares clear without erythema, swelling, mucoid exudates. Oral hygiene is good. No erythema, swelling, or exudate. Tongue  normal, non-obstructing. Tonsils not swollen or erythematous. Hearing normal.  Neck: Supple, thyroid not palpable. No bruits, nodes or JVD. Respiratory: Respiratory effort normal.  BS equal and clear bilateral without rales, rhonci, wheezing or stridor. Cardio: Heart sounds are normal with regular rate and rhythm and no murmurs, rubs or gallops. Peripheral pulses are normal and equal bilaterally without edema. No aortic or femoral bruits. Chest: symmetric with normal excursions and percussion.  Abdomen: Soft, with Nl bowel sounds. Nontender, no guarding, rebound, hernias, masses, or organomegaly.  Lymphatics: Non tender without lymphadenopathy.  Musculoskeletal: Full ROM all peripheral extremities, joint stability, 5/5 strength, and normal gait. Skin: Warm and dry without rashes, lesions, cyanosis, clubbing or  ecchymosis.  Neuro: Cranial nerves intact, reflexes equal bilaterally.  Normal muscle tone, no cerebellar symptoms. Sensation intact.  Pysch: Alert and oriented X 3 with normal affect, insight and judgment appropriate.   Assessment and Plan  1. Labile hypertension  - EKG 12-Lead - Urinalysis, Routine w reflex microscopic - Microalbumin / creatinine urine ratio - CBC with Differential/Platelet - COMPLETE METABOLIC PANEL WITH GFR - Magnesium - TSH  2. Hyperlipidemia, mixed  - EKG 12-Lead - Lipid panel - TSH  3. Abnormal glucose  - EKG 12-Lead - Hemoglobin A1c - Insulin, random  4. Vitamin D deficiency  - VITAMIN D 25 Hydroxy  5. Prediabetes  - EKG 12-Lead - Hemoglobin A1c - Insulin, random  6. BPH with obstruction/lower urinary tract symptoms  - PSA  7. Statin myopathy   8. Screening for colorectal cancer  - Cologuard  9. Prostate cancer screening  - PSA  10. Screening for ischemic heart disease  - EKG 12-Lead  11. FHx: heart disease  - EKG 12-Lead  12. Former smoker  - EKG 12-Lead  13. Screening for AAA (aortic abdominal aneurysm)   14.  Medication management  - Urinalysis, Routine w reflex microscopic - Microalbumin / creatinine urine ratio - CBC with Differential/Platelet - COMPLETE METABOLIC PANEL WITH GFR - Magnesium - Lipid panel - TSH - Hemoglobin A1c - Insulin, random - VITAMIN D 25 Hydroxy          Patient was counseled in prudent diet, weight control to achieve/maintain BMI less than 25, BP monitoring, regular exercise and medications as discussed.  Discussed med effects and SE's. Routine screening labs and tests as requested with regular follow-up as recommended. Over 40 minutes of exam, counseling, chart review and high complex critical decision making was performed   Kirtland Bouchard, MD

## 2021-03-21 NOTE — Progress Notes (Signed)
============================================================ -   Test results slightly outside the reference range are not unusual. If there is anything important, I will review this with you,  otherwise it is considered normal test values.  If you have further questions,  please do not hesitate to contact me at the office or via My Chart.  ============================================================ ============================================================  - Total Chol = 174 -  Excellent   - Very low risk for Heart Attack  / Stroke ============================================================ ============================================================  -  A1c = 6.2% is Blood sugar and A1c are elevated in the borderline and  early or pre-diabetes range which has the same   300% increased risk for heart attack, stroke, cancer and   alzheimer- type vascular dementia as full blown diabetes.   But the good news is that diet, exercise with  weight loss can cure the early diabetes at this point. ============================================================ ============================================================  -   It is very important that you work harder with diet by  avoiding all foods that are white except chicken,   fish & calliflower.  - Avoid white rice  (brown & wild rice is OK),   - Avoid white potatoes  (sweet potatoes in moderation is OK),   White bread or wheat bread or anything made out of   white flour like bagels, donuts, rolls, buns, biscuits, cakes,  - pastries, cookies, pizza crust, and pasta (made from  white flour & egg whites)   - vegetarian pasta or spinach or wheat pasta is OK.  - Multigrain breads like Arnold's, Pepperidge Farm or   multigrain sandwich thins or high fiber breads like   Eureka bread or "Dave's Killer" breads that are  4 to 5 grams fiber per slice !  are best.    Diet, exercise and weight loss can reverse and cure  diabetes  in the early stages.   ============================================================ ============================================================  - Vitamin D= 53 - Low - Vitamin D goal is between 70-100.   - Please    INCREASE   your Vitamin D dose up to 10,000 units /day  !  - It is very important as a natural anti-inflammatory and helping the  immune system protect against viral infections, like the Covid-19    helping hair, skin, and nails, as well as reducing stroke and  heart attack risk.   - It helps your bones and helps with mood.  - It also decreases numerous cancer risks so please  take it as directed.   - Low Vit D is associated with a 200-300% higher risk for  CANCER   and 200-300% higher risk for HEART   ATTACK  &  STROKE.    - It is also associated with higher death rate at younger ages,   autoimmune diseases like Rheumatoid arthritis, Lupus,  Multiple Sclerosis.     - Also many other serious conditions, like depression, Alzheimer's  Dementia, infertility, muscle aches, fatigue, fibromyalgia   - just to name a few. ============================================================ ============================================================  All Else - CBC - Kidneys - Electrolytes - Liver - Magnesium & Thyroid    - all  Normal / OK ============================================================ ===========================================================

## 2021-03-22 LAB — COMPLETE METABOLIC PANEL WITH GFR
AG Ratio: 1.7 (calc) (ref 1.0–2.5)
ALT: 19 U/L (ref 9–46)
AST: 17 U/L (ref 10–35)
Albumin: 4.3 g/dL (ref 3.6–5.1)
Alkaline phosphatase (APISO): 53 U/L (ref 35–144)
BUN: 15 mg/dL (ref 7–25)
CO2: 28 mmol/L (ref 20–32)
Calcium: 9.6 mg/dL (ref 8.6–10.3)
Chloride: 101 mmol/L (ref 98–110)
Creat: 0.75 mg/dL (ref 0.70–1.35)
Globulin: 2.6 g/dL (calc) (ref 1.9–3.7)
Glucose, Bld: 93 mg/dL (ref 65–99)
Potassium: 4.2 mmol/L (ref 3.5–5.3)
Sodium: 138 mmol/L (ref 135–146)
Total Bilirubin: 0.7 mg/dL (ref 0.2–1.2)
Total Protein: 6.9 g/dL (ref 6.1–8.1)
eGFR: 98 mL/min/{1.73_m2} (ref >=60)

## 2021-03-22 LAB — CBC WITH DIFFERENTIAL/PLATELET
Absolute Monocytes: 544 cells/uL (ref 200–950)
Basophils Absolute: 60 cells/uL (ref 0–200)
Basophils Relative: 0.7 %
Eosinophils Absolute: 221 cells/uL (ref 15–500)
Eosinophils Relative: 2.6 %
HCT: 45.8 % (ref 38.5–50.0)
Hemoglobin: 15.1 g/dL (ref 13.2–17.1)
Lymphs Abs: 1394 cells/uL (ref 850–3900)
MCH: 29.1 pg (ref 27.0–33.0)
MCHC: 33 g/dL (ref 32.0–36.0)
MCV: 88.2 fL (ref 80.0–100.0)
MPV: 10.8 fL (ref 7.5–12.5)
Monocytes Relative: 6.4 %
Neutro Abs: 6282 cells/uL (ref 1500–7800)
Neutrophils Relative %: 73.9 %
Platelets: 278 10*3/uL (ref 140–400)
RBC: 5.19 10*6/uL (ref 4.20–5.80)
RDW: 12 % (ref 11.0–15.0)
Total Lymphocyte: 16.4 %
WBC: 8.5 10*3/uL (ref 3.8–10.8)

## 2021-03-22 LAB — LIPID PANEL
Cholesterol: 174 mg/dL (ref <200)
HDL: 42 mg/dL (ref >=40)
LDL Cholesterol (Calc): 104 mg/dL (calc) — ABNORMAL HIGH
Non-HDL Cholesterol (Calc): 132 mg/dL (calc) — ABNORMAL HIGH (ref <130)
Total CHOL/HDL Ratio: 4.1 (calc) (ref <5.0)
Triglycerides: 160 mg/dL — ABNORMAL HIGH (ref <150)

## 2021-03-22 LAB — VITAMIN D 25 HYDROXY (VIT D DEFICIENCY, FRACTURES): Vit D, 25-Hydroxy: 53 ng/mL (ref 30–100)

## 2021-03-22 LAB — URINALYSIS, ROUTINE W REFLEX MICROSCOPIC
Bilirubin Urine: NEGATIVE
Glucose, UA: NEGATIVE
Hgb urine dipstick: NEGATIVE
Ketones, ur: NEGATIVE
Leukocytes,Ua: NEGATIVE
Nitrite: NEGATIVE
Protein, ur: NEGATIVE
Specific Gravity, Urine: 1.008 (ref 1.001–1.035)
pH: 6 (ref 5.0–8.0)

## 2021-03-22 LAB — HEMOGLOBIN A1C
Hgb A1c MFr Bld: 6.2 % of total Hgb — ABNORMAL HIGH (ref <5.7)
Mean Plasma Glucose: 131 mg/dL
eAG (mmol/L): 7.3 mmol/L

## 2021-03-22 LAB — INSULIN, RANDOM: Insulin: 6.8 u[IU]/mL

## 2021-03-22 LAB — MICROALBUMIN / CREATININE URINE RATIO
Creatinine, Urine: 41 mg/dL (ref 20–320)
Microalb Creat Ratio: 5 mcg/mg creat (ref <30)
Microalb, Ur: 0.2 mg/dL

## 2021-03-22 LAB — PSA: PSA: 3.42 ng/mL (ref <=4.00)

## 2021-03-22 LAB — MAGNESIUM: Magnesium: 1.9 mg/dL (ref 1.5–2.5)

## 2021-03-22 LAB — TSH: TSH: 2.25 mIU/L (ref 0.40–4.50)

## 2021-03-28 DIAGNOSIS — Z1211 Encounter for screening for malignant neoplasm of colon: Secondary | ICD-10-CM | POA: Diagnosis not present

## 2021-03-28 DIAGNOSIS — Z1212 Encounter for screening for malignant neoplasm of rectum: Secondary | ICD-10-CM | POA: Diagnosis not present

## 2021-04-06 LAB — COLOGUARD: COLOGUARD: POSITIVE — AB

## 2021-04-07 ENCOUNTER — Other Ambulatory Visit: Payer: Self-pay | Admitting: Internal Medicine

## 2021-04-07 DIAGNOSIS — R195 Other fecal abnormalities: Secondary | ICD-10-CM

## 2021-04-07 NOTE — Progress Notes (Signed)
============================================================ °============================================================ ° °-  (+)   Positive Cologard -  have requested GI consult   with Dr Havery Moros   ============================================================ ============================================================

## 2021-04-17 ENCOUNTER — Encounter: Payer: Self-pay | Admitting: Internal Medicine

## 2021-04-18 ENCOUNTER — Encounter: Payer: Self-pay | Admitting: Gastroenterology

## 2021-05-15 ENCOUNTER — Ambulatory Visit: Payer: Medicare Other | Admitting: Nurse Practitioner

## 2021-05-15 ENCOUNTER — Other Ambulatory Visit: Payer: Self-pay

## 2021-05-15 ENCOUNTER — Ambulatory Visit (AMBULATORY_SURGERY_CENTER): Payer: BLUE CROSS/BLUE SHIELD | Admitting: *Deleted

## 2021-05-15 VITALS — Ht 72.0 in | Wt 198.0 lb

## 2021-05-15 DIAGNOSIS — Z1211 Encounter for screening for malignant neoplasm of colon: Secondary | ICD-10-CM

## 2021-05-15 MED ORDER — NA SULFATE-K SULFATE-MG SULF 17.5-3.13-1.6 GM/177ML PO SOLN: 1.0000 | ORAL | 0 refills | Status: DC

## 2021-05-15 NOTE — Progress Notes (Signed)
Patient's pre-visit was done today over the phone with the patient. Name,DOB and address verified. Patient denies any allergies to Eggs and Soy. Patient denies any problems with anesthesia/sedation. Patient is not taking any diet pills or blood thinners. No home Oxygen. PONV.  Prep instructions sent to pt's MyChart-pt aware. Patient understands to call us back with any questions or concerns. Patient is aware of our care-partner policy and [id number] safety protocol.   EMMI education assigned to the patient for the procedure, sent to [place].

## 2021-05-21 ENCOUNTER — Telehealth: Payer: Self-pay | Admitting: Gastroenterology

## 2021-05-21 NOTE — Telephone Encounter (Signed)
Inbound call from patient, states that he went to  pharmacy to get Suprep for his procedure on 2/15 and the give him a different prep medication. Seeking advice, please advise.

## 2021-05-21 NOTE — Telephone Encounter (Signed)
Pt went to Pharmacy and got his Suprep - he received the generic and asked if that was ok-  yes, generic suprep is ok - pt verbalized understanding

## 2021-05-29 ENCOUNTER — Encounter: Payer: Self-pay | Admitting: Gastroenterology

## 2021-05-29 ENCOUNTER — Ambulatory Visit (AMBULATORY_SURGERY_CENTER): Payer: Medicare Other | Admitting: Gastroenterology

## 2021-05-29 VITALS — BP 122/62 | HR 55 | Temp 97.6°F | Resp 12 | Ht 72.0 in | Wt 198.0 lb

## 2021-05-29 DIAGNOSIS — D127 Benign neoplasm of rectosigmoid junction: Secondary | ICD-10-CM

## 2021-05-29 DIAGNOSIS — K621 Rectal polyp: Secondary | ICD-10-CM | POA: Diagnosis not present

## 2021-05-29 DIAGNOSIS — Z1211 Encounter for screening for malignant neoplasm of colon: Secondary | ICD-10-CM

## 2021-05-29 DIAGNOSIS — D125 Benign neoplasm of sigmoid colon: Secondary | ICD-10-CM

## 2021-05-29 DIAGNOSIS — D123 Benign neoplasm of transverse colon: Secondary | ICD-10-CM

## 2021-05-29 DIAGNOSIS — D122 Benign neoplasm of ascending colon: Secondary | ICD-10-CM

## 2021-05-29 DIAGNOSIS — R195 Other fecal abnormalities: Secondary | ICD-10-CM | POA: Diagnosis not present

## 2021-05-29 MED ORDER — SODIUM CHLORIDE 0.9 % IV SOLN: 500.0000 mL | Freq: Once | INTRAVENOUS | Status: DC

## 2021-05-29 NOTE — Progress Notes (Signed)
Pt's states no medical or surgical changes since previsit or office visit. 

## 2021-05-29 NOTE — Progress Notes (Signed)
[facility] History and Physical   Primary Care Physician:  Unk Pinto, MD   Reason for Procedure:   Positive cologuard  Plan:    colonoscopy     HPI: Parker Humphrey. is a 71 y.o. male  here for colonoscopy for positive cologuard on 12/15. He denies any bowel symptoms at this time. No family history of colon cancer known. Last colonoscopy 2006. Otherwise feels well without any cardiopulmonary symptoms.    Past Medical History:  Diagnosis Date   Diabetes mellitus without complication ([place]) 34/19/3790   A1c 6.1% preDiabetes   H/O vasectomy 04/14/1985   Hyperlipidemia 04/15/2003   Hypertension 04/15/2007   labile-monitoring expectantly   Hypogonadism male 01/12/2013   Testosterone = 255 (Patient declined treatment)   Post-operative nausea and vomiting    Vitamin D deficiency 04/14/2006   Vit D = 37    Past Surgical History:  Procedure Laterality Date   COLONOSCOPY N/A 04/14/2004   INGUINAL HERNIA REPAIR Bilateral 04/15/2007   INGUINAL HERNIA REPAIR Left 04/15/1995   INGUINAL HERNIA REPAIR Left 04/14/1996   KNEE ARTHROSCOPY Left 04/14/2008   TOTAL KNEE ARTHROPLASTY Left 05/2019   VASECTOMY Bilateral 04/14/1985    Prior to Admission medications   Medication Sig Start Date End Date Taking? Authorizing Provider  Ascorbic Acid (VITAMIN C PO) Take by mouth.   Yes [provider]  aspirin 81 MG tablet Take 81 mg by mouth daily.   Yes [provider]  Cholecalciferol (VITAMIN D PO) Take 2,000 Units by mouth daily.    Yes [provider]  Cinnamon 500 MG TABS Take by mouth 2 (two) times daily.   Yes [provider]  ezetimibe (ZETIA) 10 MG tablet TAKE 1 TABLET BY MOUTH EVERY DAY FOR CHOLESTEROL 03/18/21  Yes Liane Comber, NP  TURMERIC PO Take 1,000 mg by mouth 2 (two) times daily.   Yes [provider]  Zinc 50 MG TABS Take by mouth.   Yes [provider]    Current Outpatient Medications   Medication Sig Dispense Refill   Ascorbic Acid (VITAMIN C PO) Take by mouth.     aspirin 81 MG tablet Take 81 mg by mouth daily.     Cholecalciferol (VITAMIN D PO) Take 2,000 Units by mouth daily.      Cinnamon 500 MG TABS Take by mouth 2 (two) times daily.     ezetimibe (ZETIA) 10 MG tablet TAKE 1 TABLET BY MOUTH EVERY DAY FOR CHOLESTEROL 90 tablet 3   TURMERIC PO Take 1,000 mg by mouth 2 (two) times daily.     Zinc 50 MG TABS Take by mouth.     Current Facility-Administered Medications  Medication Dose Route Frequency Provider Last Rate Last Admin   0.9 %  sodium chloride infusion  500 mL Intravenous Once Damiel Barthold, Carlota Raspberry, MD        Allergies as of 05/29/2021 - Review Complete 05/29/2021  Allergen Reaction Noted   Pravastatin Rash 09/14/2013   Red yeast rice [cholestin] Rash 12/31/2013    Family History  Problem Relation Age of Onset   Cancer Father    Heart disease Father    Diabetes Brother    Hyperlipidemia Brother    Colon cancer Neg Hx    Colon polyps Neg Hx    Esophageal cancer Neg Hx    Stomach cancer Neg Hx    Rectal cancer Neg Hx     Social History   Socioeconomic History   Marital status: Married  Spouse name: Not on file   Number of children: Not on file   Years of education: Not on file   Highest education level: Not on file  Occupational History   Not on file  Tobacco Use   Smoking status: Former    Years: 10.00    Types: Cigarettes    Quit date: 09/15/1991    Years since quitting: 29.7   Smokeless tobacco: Never  Vaping Use   Vaping Use: Never used  Substance and Sexual Activity   Alcohol use: No   Drug use: No   Sexual activity: Yes  Other Topics Concern   Not on file  Social History Narrative   Not on file   Social Determinants of Health   Financial Resource Strain: Not on file  Food Insecurity: Not on file  Transportation Needs: Not on file  Physical Activity: Not on file  Stress: Not on file  Social Connections: Not on file   Intimate Partner Violence: Not on file    Review of Systems: All other review of systems negative except as mentioned in the HPI.  Physical Exam: Vital signs BP 138/66    Pulse 61    Temp 97.6 F (36.4 C) (Temporal)    Resp 11    Ht 6' (1.829 m)    Wt 198 lb (89.8 kg)    SpO2 98%    BMI 26.85 kg/m   General:   Alert,  Well-developed, pleasant and cooperative in NAD Lungs:  Clear throughout to auscultation.   Heart:  Regular rate and rhythm Abdomen:  Soft, nontender and nondistended.   Neuro/Psych:  Alert and cooperative. Normal mood and affect. A and O x 3  Jolly Mango, MD [facility]

## 2021-05-29 NOTE — Op Note (Signed)
[facility] Patient Name: Parker Humphrey Procedure Date: 05/29/2021 1:28 PM MRN: [id number] Endoscopist: Remo Lipps P. Havery Moros , MD Age: 71 Referring MD:  Date of Birth: 1950-10-18 Gender: Male Account #: [id number] Procedure:                Colonoscopy Indications:              Positive Cologuard test Medicines:                Monitored Anesthesia Care Procedure:                Pre-Anesthesia Assessment:                           - Prior to the procedure, a History and Physical                            was performed, and patient medications and                            allergies were reviewed. The patient's tolerance of                            previous anesthesia was also reviewed. The risks                            and benefits of the procedure and the sedation                            options and risks were discussed with the patient.                            All questions were answered, and informed consent                            was obtained. Prior Anticoagulants: The patient has                            taken no previous anticoagulant or antiplatelet                            agents. ASA Grade Assessment: III - A patient with                            severe systemic disease. After reviewing the risks                            and benefits, the patient was deemed in                            satisfactory condition to undergo the procedure.                           After obtaining informed consent, the colonoscope  was passed under direct vision. Throughout the                            procedure, the patient's blood pressure, pulse, and                            oxygen saturations were monitored continuously. The                            CF [id number] #[id number] was introduced through the anus                            and advanced to the the cecum, identified by                            appendiceal orifice and ileocecal  valve. The                            colonoscopy was performed without difficulty. The                            patient tolerated the procedure well. The quality                            of the bowel preparation was good. The ileocecal                            valve, appendiceal orifice, and rectum were                            photographed. Scope In: 1:33:30 PM Scope Out: 2:04:27 PM Scope Withdrawal Time: 0 hours 28 minutes 3 seconds  Total Procedure Duration: 0 hours 30 minutes 57 seconds  Findings:                 The perianal and digital rectal examinations were                            normal.                           Many small-mouthed diverticula were found in the                            transverse colon and left colon.                           Two sessile polyps were found in the ascending                            colon. The polyps were 2 to 3 mm in size. These                            polyps were removed with a cold snare. Resection  and retrieval were complete.                           Four sessile polyps were found in the transverse                            colon. The polyps were 3 to 4 mm in size. These                            polyps were removed with a cold snare. Resection                            and retrieval were complete.                           Two sessile polyps were found in the sigmoid colon.                            The polyps were 3 to 4 mm in size. These polyps                            were removed with a cold snare. Resection and                            retrieval were complete.                           A 10 mm polyp was found in the recto-sigmoid colon.                            The polyp was pedunculated. The polyp was removed                            with a hot snare. Resection and retrieval were                            complete.                           An area of mucosa was found in the  distal rectum                            that was altered. Likely benign / prolapse change.                            Biopsies were taken with a cold forceps for                            histology to rule out flat adenoma.                           Internal hemorrhoids were found during  retroflexion. The hemorrhoids were moderate.                           The exam was otherwise without abnormality. Complications:            No immediate complications. Estimated blood loss:                            Minimal. Estimated Blood Loss:     Estimated blood loss was minimal. Impression:               - Diverticulosis in the transverse colon and in the                            left colon.                           - Two 2 to 3 mm polyps in the ascending colon,                            removed with a cold snare. Resected and retrieved.                           - Four 3 to 4 mm polyps in the transverse colon,                            removed with a cold snare. Resected and retrieved.                           - Two 3 to 4 mm polyps in the sigmoid colon,                            removed with a cold snare. Resected and retrieved.                           - One 10 mm polyp at the recto-sigmoid colon,                            removed with a hot snare. Resected and retrieved.                           - Altered mucosa in the distal rectum, likely                            benign change. Biopsied.                           - Internal hemorrhoids.                           - The examination was otherwise normal. Recommendation:           - Patient has a contact number available for  emergencies. The signs and symptoms of potential                            delayed complications were discussed with the                            patient. Return to normal activities tomorrow.                            Written discharge instructions were  provided to the                            patient.                           - Resume previous diet.                           - Continue present medications.                           - Await pathology results. Remo Lipps P. Dondi Aime, MD 05/29/2021 2:11:46 PM This report has been signed electronically.

## 2021-05-29 NOTE — Progress Notes (Signed)
A and O x3. Report to RN. Tolerated MAC anesthesia well.

## 2021-05-29 NOTE — Patient Instructions (Signed)
Please read handouts provided. Continue present medications. Await pathology results.   YOU HAD AN ENDOSCOPIC PROCEDURE TODAY AT THE [facility]:   Refer to the procedure report that was given to you for any specific questions about what was found during the examination.  If the procedure report does not answer your questions, please call your gastroenterologist to clarify.  If you requested that your care partner not be given the details of your procedure findings, then the procedure report has been included in a sealed envelope for you to review at your convenience later.  YOU SHOULD EXPECT: Some feelings of bloating in the abdomen. Passage of more gas than usual.  Walking can help get rid of the air that was put into your GI tract during the procedure and reduce the bloating. If you had a lower endoscopy (such as a colonoscopy or flexible sigmoidoscopy) you may notice spotting of blood in your stool or on the toilet paper. If you underwent a bowel prep for your procedure, you may not have a normal bowel movement for a few days.  Please Note:  You might notice some irritation and congestion in your nose or some drainage.  This is from the oxygen used during your procedure.  There is no need for concern and it should clear up in a day or so.  SYMPTOMS TO REPORT IMMEDIATELY:  Following lower endoscopy (colonoscopy or flexible sigmoidoscopy):  Excessive amounts of blood in the stool  Significant tenderness or worsening of abdominal pains  Swelling of the abdomen that is new, acute  Fever of 100F or higher   For urgent or emergent issues, a gastroenterologist can be reached at any hour by calling [phone]. Do not use MyChart messaging for urgent concerns.    DIET:  We do recommend a small meal at first, but then you may proceed to your regular diet.  Drink plenty of fluids but you should avoid alcoholic beverages for 24 hours.  ACTIVITY:  You should plan to take it easy  for the rest of today and you should NOT DRIVE or use heavy machinery until tomorrow (because of the sedation medicines used during the test).    FOLLOW UP: Our staff will call the number listed on your records 48-72 hours following your procedure to check on you and address any questions or concerns that you may have regarding the information given to you following your procedure. If we do not reach you, we will leave a message.  We will attempt to reach you two times.  During this call, we will ask if you have developed any symptoms of COVID 19. If you develop any symptoms (ie: fever, flu-like symptoms, shortness of breath, cough etc.) before then, please call [phone].  If you test positive for Covid 19 in the 2 weeks post procedure, please call and report this information to us.    If any biopsies were taken you will be contacted by phone or by letter within the next 1-3 weeks.  Please call us at [phone] if you have not heard about the biopsies in 3 weeks.    SIGNATURES/CONFIDENTIALITY: You and/or your care partner have signed paperwork which will be entered into your electronic medical record.  These signatures attest to the fact that that the information above on your After Visit Summary has been reviewed and is understood.  Full responsibility of the confidentiality of this discharge information lies with you and/or your care-partner.  

## 2021-05-29 NOTE — Progress Notes (Signed)
Called to room to assist during endoscopic procedure.  Patient ID and intended procedure confirmed with present staff. Received instructions for my participation in the procedure from the performing physician.  

## 2021-05-31 ENCOUNTER — Telehealth: Payer: Self-pay

## 2021-05-31 NOTE — Telephone Encounter (Signed)
°  Follow up Call-  Call back number 05/29/2021  Post procedure Call Back phone  # [phone]  Permission to leave phone message Yes  Some recent data might be hidden     Patient questions:  Do you have a fever, pain , or abdominal swelling? No. Pain Score  0 *  Have you tolerated food without any problems? Yes.    Have you been able to return to your normal activities? Yes.    Do you have any questions about your discharge instructions: Diet   No. Medications  No. Follow up visit  No.  Do you have questions or concerns about your Care? No.  Actions: * If pain score is 4 or above: No action needed, pain <4.

## 2021-07-26 DIAGNOSIS — U071 COVID-19: Secondary | ICD-10-CM | POA: Diagnosis not present

## 2021-08-12 DIAGNOSIS — L309 Dermatitis, unspecified: Secondary | ICD-10-CM | POA: Diagnosis not present

## 2021-08-12 DIAGNOSIS — D1801 Hemangioma of skin and subcutaneous tissue: Secondary | ICD-10-CM | POA: Diagnosis not present

## 2021-08-12 DIAGNOSIS — D2371 Other benign neoplasm of skin of right lower limb, including hip: Secondary | ICD-10-CM | POA: Diagnosis not present

## 2021-08-12 DIAGNOSIS — L918 Other hypertrophic disorders of the skin: Secondary | ICD-10-CM | POA: Diagnosis not present

## 2021-08-12 DIAGNOSIS — L720 Epidermal cyst: Secondary | ICD-10-CM | POA: Diagnosis not present

## 2021-08-12 DIAGNOSIS — L814 Other melanin hyperpigmentation: Secondary | ICD-10-CM | POA: Diagnosis not present

## 2021-08-12 DIAGNOSIS — L821 Other seborrheic keratosis: Secondary | ICD-10-CM | POA: Diagnosis not present

## 2021-08-12 DIAGNOSIS — L570 Actinic keratosis: Secondary | ICD-10-CM | POA: Diagnosis not present

## 2021-08-12 DIAGNOSIS — D224 Melanocytic nevi of scalp and neck: Secondary | ICD-10-CM | POA: Diagnosis not present

## 2021-09-17 DIAGNOSIS — Z860101 Personal history of adenomatous and serrated colon polyps: Secondary | ICD-10-CM | POA: Insufficient documentation

## 2021-09-17 DIAGNOSIS — Z86010 Personal history of colonic polyps: Secondary | ICD-10-CM | POA: Insufficient documentation

## 2021-09-17 NOTE — Progress Notes (Unsigned)
MEDICARE ANNUAL WELLNESS VISIT AND FOLLOW UP  Assessment:   [name] was seen today for follow-up.  Diagnoses and all orders for this visit:  Annual Medicare Wellness Visit Due annually  Health maintenance reviewed  Laible hypertension Controlled no medications Monitor blood pressure at home; call if consistently over 130/80 Continue DASH diet.   Reminder to go to the ER if any CP, SOB, nausea, dizziness, severe HA, changes vision/speech, left arm numbness and tingling and jaw pain.  Hyperlipidemia, mixed Continue medications: zetia 10 mg -  LDL goal <100; patient prefers to work on lifestyle, declines switch to statin Continue low cholesterol diet and exercise.  Check lipid panel.  -     Lipid panel -     TSH  Vitamin D deficiency At goal at recent check; continue to recommend supplementation for goal of 70-100 Defer vitamin D level  Abnormal glucose (prediabetes) Discussed disease and risks Discussed diet/exercise, weight management  - A1C  History of adenomatous polyp of colon Next colonoscopy due 05/2024 per Dr. Havery Moros Encourage high fiber diet, low red/processed meat ***  Medication management Continued  BMI 27.0-27.9,adult Continue to recommend diet heavy in fruits and veggies and low in animal meats, cheeses, and dairy products, appropriate calorie intake Discuss exercise recommendations routinely Continue to monitor weight at each visit  No orders of the defined types were placed in this encounter.    Over 30 minutes of face to face interview, exam, counseling, chart review, and critical decision making was performed  Future Appointments  Date Time Provider [place]  09/18/2021  9:30 AM Liane Comber, NP [facility] None  03/26/2022  2:00 PM Unk Pinto, MD [facility] None     Plan:   During the course of the visit the patient was educated and counseled about appropriate screening and preventive services including:   Pneumococcal  vaccine  Influenza vaccine Prevnar 13 Td vaccine Screening electrocardiogram Colorectal cancer screening Diabetes screening Glaucoma screening Nutrition counseling    Subjective:  Parker Humphrey. is a 71 y.o. male who presents for Medicare Annual Wellness Visit and 3 month follow up. He has Essential hypertension; Hyperlipidemia; Vitamin D deficiency; Prediabetes; Medication management; and History of adenomatous polyp of colon on their problem list.  He reports overall he is doing well.  He does not hav any health or medication concerns today.  He had + cologuard 03/2021, was referred to colonoscopy which was positive for several adenomatous polyps by Dr. Havery Moros on 05/29/2021, was recommended 3 year recall ***         BMI is There is no height or weight on file to calculate BMI., he has been working on diet and exercise. Wt Readings from Last 3 Encounters:  05/29/21 198 lb (89.8 kg)  05/15/21 198 lb (89.8 kg)  03/19/21 198 lb 6.4 oz (90 kg)   His blood pressure has been controlled at home, today their BP is   He does not workout related to orthopedic constraints with his knee. He denies chest pain, shortness of breath, dizziness.   He is on cholesterol medication (zeita 10 mg) and denies myalgias. His cholesterol is not at goal. The cholesterol last visit was:   Lab Results  Component Value Date   CHOL 174 03/19/2021   HDL 42 03/19/2021   LDLCALC 104 (H) 03/19/2021   TRIG 160 (H) 03/19/2021   CHOLHDL 4.1 03/19/2021   He has been working on diet and exercise for prediabetes, and denies increased appetite, nausea, paresthesia of the  feet, polydipsia, polyuria, visual disturbances and vomiting. Last A1C in the office was:  Lab Results  Component Value Date   HGBA1C 6.2 (H) 03/19/2021   Last GFR Lab Results  Component Value Date   EGFR 98 03/19/2021   Patient is on Vitamin D supplement.   Lab Results  Component Value Date   VD25OH 53 03/19/2021      Medication  Review:   Current Outpatient Medications (Cardiovascular):    ezetimibe (ZETIA) 10 MG tablet, TAKE 1 TABLET BY MOUTH EVERY DAY FOR CHOLESTEROL   Current Outpatient Medications (Analgesics):    aspirin 81 MG tablet, Take 81 mg by mouth daily.   Current Outpatient Medications (Other):    Ascorbic Acid (VITAMIN C PO), Take by mouth.   Cholecalciferol (VITAMIN D PO), Take 2,000 Units by mouth daily.    Cinnamon 500 MG TABS, Take by mouth 2 (two) times daily.   TURMERIC PO, Take 1,000 mg by mouth 2 (two) times daily.   Zinc 50 MG TABS, Take by mouth.  Allergies: Allergies  Allergen Reactions   Pravastatin Rash   Red Yeast Rice [Cholestin] Rash    Current Problems (verified) has Essential hypertension; Hyperlipidemia; Vitamin D deficiency; Prediabetes; and Medication management on their problem list.  Screening Tests Immunization History  Administered Date(s) Administered   DTaP 04/14/2009   Influenza Split 01/12/2013   PPD Test 09/14/2013, 09/19/2014, 10/02/2015, 11/04/2017   Pneumococcal Conjugate-13 05/12/2018   Pneumococcal Polysaccharide-23 04/14/1997   Pneumococcal-Unspecified 01/13/2019   Td 04/14/1997, 01/11/2020   Tdap 07/31/2009   Health Maintenance  Topic Date Due   COVID-19 Vaccine (1) Never done   Zoster Vaccines- Shingrix (1 of 2) Never done   Pneumonia Vaccine 36+ Years old (3) 01/13/2020   INFLUENZA VACCINE  11/12/2021   Fecal DNA (Cologuard)  03/28/2024   TETANUS/TDAP  01/10/2030   Hepatitis C Screening  Completed   HPV VACCINES  Aged Out   Last colonoscopy: 05/2021, Dr. Havery Moros, adenomatous polyps, 3 year recall  Last cologuard: + 03/2021   Influenza: Declines  Shingles/Zostavax: declines  Names of Other Physician/Practitioners you currently use: 1. [facility] here for primary care 2. [facility], Dr Laban Emperor, last visit 2021,  3. Dentist, last visit, 2020, goes q69m Patient Care Team: MUnk Pinto MD  as PCP - General (Internal Medicine)  Surgical: He  has a past surgical history that includes Knee arthroscopy (Left, 04/14/2008); Vasectomy (Bilateral, 04/14/1985); Inguinal hernia repair (Bilateral, 04/15/2007); Inguinal hernia repair (Left, 04/15/1995); Inguinal hernia repair (Left, 04/14/1996); Colonoscopy (N/A, 04/14/2004); and Total knee arthroplasty (Left, 05/2019). Family His family history includes Cancer in his father; Diabetes in his brother; Heart disease in his father; Hyperlipidemia in his brother. Social history  He reports that he quit smoking about 30 years ago. His smoking use included cigarettes. He has never used smokeless tobacco. He reports that he does not drink alcohol and does not use drugs.  MEDICARE WELLNESS OBJECTIVES: Physical activity:   Cardiac risk factors:   Depression/mood screen:      05/15/2020    9:33 AM  Depression screen PHQ 2/9  Decreased Interest 0  Down, Depressed, Hopeless 0  PHQ - 2 Score 0    ADLs:      View : No data to display.           Cognitive Testing  Alert? Yes  Normal Appearance?Yes  Oriented to person? Yes  Place? Yes   Time? Yes  Recall of three objects?  Yes  Can perform simple calculations? Yes  Displays appropriate judgment?Yes  Can read the correct time from a watch face?Yes  EOL planning:     Objective:   There were no vitals filed for this visit.  There is no height or weight on file to calculate BMI.  General appearance: alert, no distress, WD/WN, male HEENT: normocephalic, sclerae anicteric, TMs pearly, nares patent, no discharge or erythema, pharynx normal Oral cavity: MMM, no lesions Neck: supple, no lymphadenopathy, no thyromegaly, no masses Heart: RRR, normal S1, S2, no murmurs Lungs: CTA bilaterally, no wheezes, rhonchi, or rales Abdomen: +bs, soft, non tender, non distended, no masses, no hepatomegaly, no splenomegaly Musculoskeletal: nontender, no swelling, no obvious deformity Extremities: no  edema, no cyanosis, no clubbing Pulses: 2+ symmetric, upper and lower extremities, normal cap refill Neurological: alert, oriented x 3, CN2-12 intact, strength normal upper extremities and lower extremities, sensation normal throughout, DTRs 2+ throughout, no cerebellar signs, gait normal Psychiatric: normal affect, behavior normal, pleasant   Medicare Attestation I have personally reviewed: The patient's medical and social history Their use of alcohol, tobacco or illicit drugs Their current medications and supplements The patient's functional ability including ADLs,fall risks, home safety risks, cognitive, and hearing and visual impairment Diet and physical activities  Evidence for depression or mood disorders  The patient's weight, height, BMI, and visual acuity have been recorded in the chart.  I have made referrals, counseling, and provided education to the patient based on review of the above and I have provided the patient with a written personalized care plan for preventive services.     Izora Ribas, NP   05/15/2020

## 2021-09-18 ENCOUNTER — Ambulatory Visit (INDEPENDENT_AMBULATORY_CARE_PROVIDER_SITE_OTHER): Payer: Medicare Other | Admitting: Adult Health

## 2021-09-18 ENCOUNTER — Encounter: Payer: Self-pay | Admitting: Adult Health

## 2021-09-18 VITALS — BP 138/88 | HR 59 | Temp 97.5°F | Wt 184.0 lb

## 2021-09-18 DIAGNOSIS — Z6824 Body mass index (BMI) 24.0-24.9, adult: Secondary | ICD-10-CM | POA: Diagnosis not present

## 2021-09-18 DIAGNOSIS — Z0001 Encounter for general adult medical examination with abnormal findings: Secondary | ICD-10-CM | POA: Diagnosis not present

## 2021-09-18 DIAGNOSIS — R6889 Other general symptoms and signs: Secondary | ICD-10-CM | POA: Diagnosis not present

## 2021-09-18 DIAGNOSIS — R7309 Other abnormal glucose: Secondary | ICD-10-CM | POA: Diagnosis not present

## 2021-09-18 DIAGNOSIS — R7303 Prediabetes: Secondary | ICD-10-CM | POA: Diagnosis not present

## 2021-09-18 DIAGNOSIS — Z6826 Body mass index (BMI) 26.0-26.9, adult: Secondary | ICD-10-CM

## 2021-09-18 DIAGNOSIS — E559 Vitamin D deficiency, unspecified: Secondary | ICD-10-CM

## 2021-09-18 DIAGNOSIS — Z79899 Other long term (current) drug therapy: Secondary | ICD-10-CM

## 2021-09-18 DIAGNOSIS — I10 Essential (primary) hypertension: Secondary | ICD-10-CM | POA: Diagnosis not present

## 2021-09-18 DIAGNOSIS — Z86010 Personal history of colonic polyps: Secondary | ICD-10-CM | POA: Diagnosis not present

## 2021-09-18 DIAGNOSIS — E782 Mixed hyperlipidemia: Secondary | ICD-10-CM

## 2021-09-18 DIAGNOSIS — Z0000 Encounter for general adult medical examination without abnormal findings: Secondary | ICD-10-CM

## 2021-09-18 NOTE — Patient Instructions (Addendum)
[name] , Thank you for taking time to come for your Medicare Wellness Visit. I appreciate your ongoing commitment to your health goals. Please review the following plan we discussed and let me know if I can assist you in the future.   These are the goals we discussed:  Goals      Exercise 150 min/wk Moderate Activity     15-20 min daily of brisk walking for cardiovascular health         This is a list of the screening recommended for you and due dates:  Health Maintenance  Topic Date Due   COVID-19 Vaccine (1) 10/04/2021*   Zoster (Shingles) Vaccine (1 of 2) 12/19/2021*   Flu Shot  11/12/2021   Colon Cancer Screening  05/29/2024   Tetanus Vaccine  01/10/2030   Hepatitis C Screening: USPSTF Recommendation to screen - Ages 18-79 yo.  Completed   HPV Vaccine  Aged Out   Pneumonia Vaccine  Discontinued   Cologuard (Stool DNA test)  Discontinued  *Topic was postponed. The date shown is not the original due date.           High-Fiber Eating Plan Fiber, also called dietary fiber, is a type of carbohydrate. It is found foods such as fruits, vegetables, whole grains, and beans. A high-fiber diet can have many health benefits. Your health care provider may recommend a high-fiber diet to help: Prevent constipation. Fiber can make your bowel movements more regular. Lower your cholesterol. Relieve the following conditions: Inflammation of veins in the anus (hemorrhoids). Inflammation of specific areas of the digestive tract (uncomplicated diverticulosis). A problem of the large intestine, also called the colon, that sometimes causes pain and diarrhea (irritable bowel syndrome, or IBS). Prevent overeating as part of a weight-loss plan. Prevent heart disease, type 2 diabetes, and certain cancers. What are tips for following this plan? Reading food labels  Check the nutrition facts label on food products for the amount of dietary fiber. Choose foods that have 5 grams of fiber or  more per serving. The goals for recommended daily fiber intake include: Men (age 46 or younger): 34-38 g. Men (over age 47): 28-34 g. Women (age 64 or younger): 25-28 g. Women (over age 78): 22-25 g. Your daily fiber goal is _____________ g. Shopping Choose whole fruits and vegetables instead of processed forms, such as apple juice or applesauce. Choose a wide variety of high-fiber foods such as avocados, lentils, oats, and kidney beans. Read the nutrition facts label of the foods you choose. Be aware of foods with added fiber. These foods often have high sugar and sodium amounts per serving. Cooking Use whole-grain flour for baking and cooking. Cook with brown rice instead of white rice. Meal planning Start the day with a breakfast that is high in fiber, such as a cereal that contains 5 g of fiber or more per serving. Eat breads and cereals that are made with whole-grain flour instead of refined flour or white flour. Eat brown rice, bulgur wheat, or millet instead of white rice. Use beans in place of meat in soups, salads, and pasta dishes. Be sure that half of the grains you eat each day are whole grains. General information You can get the recommended daily intake of dietary fiber by: Eating a variety of fruits, vegetables, grains, nuts, and beans. Taking a fiber supplement if you are not able to take in enough fiber in your diet. It is better to get fiber through food than from a supplement.  Gradually increase how much fiber you consume. If you increase your intake of dietary fiber too quickly, you may have bloating, cramping, or gas. Drink plenty of water to help you digest fiber. Choose high-fiber snacks, such as berries, raw vegetables, nuts, and popcorn. What foods should I eat? Fruits Berries. Pears. Apples. Oranges. Avocado. Prunes and raisins. Dried figs. Vegetables Sweet potatoes. Spinach. Kale. Artichokes. Cabbage. Broccoli. Cauliflower. Green peas. Carrots.  Squash. Grains Whole-grain breads. Multigrain cereal. Oats and oatmeal. Brown rice. Barley. Bulgur wheat. [place]. Quinoa. Bran muffins. Popcorn. Rye wafer crackers. Meats and other proteins Navy beans, kidney beans, and pinto beans. Soybeans. Split peas. Lentils. Nuts and seeds. Dairy Fiber-fortified yogurt. Beverages Fiber-fortified soy milk. Fiber-fortified orange juice. Other foods Fiber bars. The items listed above may not be a complete list of recommended foods and beverages. Contact a dietitian for more information. What foods should I avoid? Fruits Fruit juice. Cooked, strained fruit. Vegetables Fried potatoes. Canned vegetables. Well-cooked vegetables. Grains White bread. Pasta made with refined flour. White rice. Meats and other proteins Fatty cuts of meat. Fried chicken or fried fish. Dairy Milk. Yogurt. Cream cheese. Sour cream. Fats and oils Butters. Beverages Soft drinks. Other foods Cakes and pastries. The items listed above may not be a complete list of foods and beverages to avoid. Talk with your dietitian about what choices are best for you. Summary Fiber is a type of carbohydrate. It is found in foods such as fruits, vegetables, whole grains, and beans. A high-fiber diet has many benefits. It can help to prevent constipation, lower blood cholesterol, aid weight loss, and reduce your risk of heart disease, diabetes, and certain cancers. Increase your intake of fiber gradually. Increasing fiber too quickly may cause cramping, bloating, and gas. Drink plenty of water while you increase the amount of fiber you consume. The best sources of fiber include whole fruits and vegetables, whole grains, nuts, seeds, and beans. This information is not intended to replace advice given to you by your health care provider. Make sure you discuss any questions you have with your health care provider. Document Revised: 08/04/2019 Document Reviewed: 08/04/2019 Elsevier Patient  Education  [place].

## 2021-09-19 LAB — LIPID PANEL
Cholesterol: 169 mg/dL (ref <200)
HDL: 42 mg/dL (ref >=40)
LDL Cholesterol (Calc): 101 mg/dL (calc) — ABNORMAL HIGH
Non-HDL Cholesterol (Calc): 127 mg/dL (calc) (ref <130)
Total CHOL/HDL Ratio: 4 (calc) (ref <5.0)
Triglycerides: 162 mg/dL — ABNORMAL HIGH (ref <150)

## 2021-09-19 LAB — HEMOGLOBIN A1C
Hgb A1c MFr Bld: 6.1 % of total Hgb — ABNORMAL HIGH (ref <5.7)
Mean Plasma Glucose: 128 mg/dL
eAG (mmol/L): 7.1 mmol/L

## 2021-09-19 LAB — CBC WITH DIFFERENTIAL/PLATELET
Absolute Monocytes: 443 cells/uL (ref 200–950)
Basophils Absolute: 53 cells/uL (ref 0–200)
Basophils Relative: 0.9 %
Eosinophils Absolute: 130 cells/uL (ref 15–500)
Eosinophils Relative: 2.2 %
HCT: 45.4 % (ref 38.5–50.0)
Hemoglobin: 14.8 g/dL (ref 13.2–17.1)
Lymphs Abs: 1044 cells/uL (ref 850–3900)
MCH: 28.6 pg (ref 27.0–33.0)
MCHC: 32.6 g/dL (ref 32.0–36.0)
MCV: 87.6 fL (ref 80.0–100.0)
MPV: 11.1 fL (ref 7.5–12.5)
Monocytes Relative: 7.5 %
Neutro Abs: 4230 cells/uL (ref 1500–7800)
Neutrophils Relative %: 71.7 %
Platelets: 288 10*3/uL (ref 140–400)
RBC: 5.18 10*6/uL (ref 4.20–5.80)
RDW: 12.6 % (ref 11.0–15.0)
Total Lymphocyte: 17.7 %
WBC: 5.9 10*3/uL (ref 3.8–10.8)

## 2021-09-19 LAB — COMPLETE METABOLIC PANEL WITH GFR
AG Ratio: 1.6 (calc) (ref 1.0–2.5)
ALT: 16 U/L (ref 9–46)
AST: 14 U/L (ref 10–35)
Albumin: 4.4 g/dL (ref 3.6–5.1)
Alkaline phosphatase (APISO): 52 U/L (ref 35–144)
BUN: 16 mg/dL (ref 7–25)
CO2: 27 mmol/L (ref 20–32)
Calcium: 9.5 mg/dL (ref 8.6–10.3)
Chloride: 104 mmol/L (ref 98–110)
Creat: 0.8 mg/dL (ref 0.70–1.35)
Globulin: 2.7 g/dL (calc) (ref 1.9–3.7)
Glucose, Bld: 97 mg/dL (ref 65–99)
Potassium: 4.5 mmol/L (ref 3.5–5.3)
Sodium: 139 mmol/L (ref 135–146)
Total Bilirubin: 0.7 mg/dL (ref 0.2–1.2)
Total Protein: 7.1 g/dL (ref 6.1–8.1)
eGFR: 96 mL/min/{1.73_m2} (ref >=60)

## 2021-09-19 LAB — TSH: TSH: 2.03 mIU/L (ref 0.40–4.50)

## 2021-09-19 LAB — MAGNESIUM: Magnesium: 2 mg/dL (ref 1.5–2.5)

## 2022-01-06 ENCOUNTER — Telehealth: Payer: Self-pay

## 2022-01-06 NOTE — Patient Outreach (Signed)
  Care Coordination   Initial Visit Note   01/06/2022 Name: Parker Humphrey. MRN: [id number] DOB: Feb 03, 1951  Parker Humphrey. is a 71 y.o. year old male who sees Unk Pinto, MD for primary care. I spoke with  Parker Humphrey. by phone today.  What matters to the patients health and wellness today?  None     Goals Addressed             This Visit's Progress    COMPLETED: Care Coordination Activities-No follow up required       Care Coordination Interventions: Annual wellness visit completed in June 2023.  Patient declined ongoing education and support          SDOH assessments and interventions completed:  Yes     Care Coordination Interventions Activated:  Yes  Care Coordination Interventions:  Yes, provided   Follow up plan: No further intervention required.   Encounter Outcome:  Pt. Visit Completed   Jone Baseman, RN, MSN [facility] Care Management Coordinator Direct Line [phone]

## 2022-01-06 NOTE — Patient Instructions (Signed)
Visit Information  Thank you for taking time to visit with me today. Please don't hesitate to contact me if I can be of assistance to you.   Following are the goals we discussed today:   Goals Addressed             This Visit's Progress    COMPLETED: Care Coordination Activities-No follow up required       Care Coordination Interventions: Annual wellness visit completed in June 2023.  Patient declined ongoing education and support           If you are experiencing a Mental Health or [place] or need someone to talk to, please call the Suicide and Crisis Lifeline: 988   Patient verbalizes understanding of instructions and care plan provided today and agrees to view in [place]. Active MyChart status and patient understanding of how to access instructions and care plan via MyChart confirmed with patient.     No further follow up required: patient decline  Jone Baseman, RN, MSN [facility] Care Management Coordinator Direct Line [phone]

## 2022-03-26 ENCOUNTER — Encounter: Payer: Medicare Other | Admitting: Internal Medicine

## 2022-04-22 ENCOUNTER — Encounter: Payer: Self-pay | Admitting: Internal Medicine

## 2022-04-22 NOTE — Progress Notes (Unsigned)
Annual  Screening/Preventative Visit  & Comprehensive Evaluation & Examination   Future Appointments  Date Time Provider Department  04/23/2022 10:00 AM Unk Pinto, MD [facility]  05/04/2023 10:00 AM Unk Pinto, MD [facility]            This very nice 72 y.o. MWM presents for a Screening /Preventative Visit & comprehensive evaluation and management of multiple medical co-morbidities.  Patient has been followed for HTN, HLD, Prediabetes and Vitamin D Deficiency.       Labile HTN predates since  2002. Patient's BP has been controlled at home.  Today's BP was 138/76 . Patient denies any cardiac symptoms as chest pain, palpitations, shortness of breath, dizziness or ankle swelling.       Patient is statin intolerant & his hyperlipidemia is not controlled with diet and Ezetimibe. Patient denies myalgias or other medication SE's. Last lipids were not at goal :  Lab Results  Component Value Date   CHOL 184 05/15/2020   HDL 38 (L) 05/15/2020   LDLCALC 112 (H) 05/15/2020   TRIG 225 (H) 05/15/2020   CHOLHDL 4.8 05/15/2020         Patient has hx/o prediabetes (A1c 6.1% /2012) and patient denies reactive hypoglycemic symptoms, visual blurring, diabetic polys or paresthesias. Last A1c was not at goal :   Lab Results  Component Value Date   HGBA1C 6.0 (H) 01/11/2020          Finally, patient has history of Vitamin D Deficiency  and last vitamin D was not at goal :   Lab Results  Component Value Date   VD25OH 52 01/11/2020     Current Outpatient Medications on File Prior to Visit  Medication Sig   aspirin 81 MG  Take daily.   VITAMIN D 2,000 Units Take   daily.    Cinnamon 500 MG  Take  2  times daily.   ezetimibe (ZETIA) 10 MG  TAKE 1 TABLET EVERY DAY    TURMERIC 1,000 mg  Take 2 times daily.   Zinc 50 MG  Take daily     Allergies  Allergen Reactions   Pravastatin    Red Yeast Rice [Cholestin] Rash     Past Medical History:  Diagnosis Date    Diabetes mellitus without complication ([place]) 11/7562   A1c 6.1% preDiabetes   H/O vasectomy 1987   Hyperlipidemia 2005   Hypertension 2009   labile-monitoring expectantly   Hypogonadism male Oct 2014   Testosterone = 255 (Patient declined treatment)   Vitamin D deficiency 2008   Vit D = 37     Health Maintenance  Topic Date Due   COVID-19 Vaccine (1) Never done   Zoster Vaccines- Shingrix (1 of 2) Never done   Pneumonia Vaccine 85+ Years old (3 - PPSV23 if available, else PCV20) 01/13/2020   INFLUENZA VACCINE  11/12/2020   Fecal DNA (Cologuard)  12/14/2020   TETANUS/TDAP  01/10/2030   Hepatitis C Screening  Completed   HPV VACCINES  Aged Out     Immunization History  Administered Date(s) Administered   DTaP 04/14/2009   Influenza Split 01/12/2013   PPD Test 09/14/2013, 09/19/2014, 10/02/2015, 11/04/2017   Pneumococcal - 13 05/12/2018   Pneumococcal - 23 04/14/1997   Pneumococcal - 23 01/13/2019   Td 04/14/1997, 01/11/2020   Tdap 07/31/2009   Last Colon -  09/23/2004 - Dr Sharlett Iles - Negative  Cologard - 12/14/2017 - Negative Cologard - 03/28/2021  -  Positive  Colonoscopy -  05/29/2021 - Dr Havery Moros - Recc 3 year f/u - due Feb 2026  Past Surgical History:  Procedure Laterality Date   COLONOSCOPY N/A 2006   INGUINAL HERNIA REPAIR Bilateral 2009   INGUINAL HERNIA REPAIR Left 1997   INGUINAL HERNIA REPAIR Left 1998   KNEE ARTHROSCOPY Left 2010   VASECTOMY Bilateral 1987     Family History  Problem Relation Age of Onset   Cancer Father    Heart disease Father    Diabetes Brother    Hyperlipidemia Brother      Social History   Tobacco Use   Smoking status: Former    Years: 10.00    Types: Cigarettes    Quit date: 09/15/1991    Years since quitting: 29.5   Smokeless tobacco: Never  Substance Use Topics   Alcohol use: No   Drug use: No      ROS Constitutional: Denies fever, chills, weight loss/gain, headaches, insomnia,  night sweats or change in  appetite. Does c/o fatigue. Eyes: Denies redness, blurred vision, diplopia, discharge, itchy or watery eyes.  ENT: Denies discharge, congestion, post nasal drip, epistaxis, sore throat, earache, hearing loss, dental pain, Tinnitus, Vertigo, Sinus pain or snoring.  Cardio: Denies chest pain, palpitations, irregular heartbeat, syncope, dyspnea, diaphoresis, orthopnea, PND, claudication or edema Respiratory: denies cough, dyspnea, DOE, pleurisy, hoarseness, laryngitis or wheezing.  Gastrointestinal: Denies dysphagia, heartburn, reflux, water brash, pain, cramps, nausea, vomiting, bloating, diarrhea, constipation, hematemesis, melena, hematochezia, jaundice or hemorrhoids Genitourinary: Denies dysuria, frequency, urgency, nocturia, hesitancy, discharge, hematuria or flank pain Musculoskeletal: Denies arthralgia, myalgia, stiffness, Jt. Swelling, pain, limp or strain/sprain. Denies Falls. Skin: Denies puritis, rash, hives, warts, acne, eczema or change in skin lesion Neuro: No weakness, tremor, incoordination, spasms, paresthesia or pain Psychiatric: Denies confusion, memory loss or sensory loss. Denies Depression. Endocrine: Denies change in weight, skin, hair change, nocturia, and paresthesia, diabetic polys, visual blurring or hyper / hypo glycemic episodes.  Heme/Lymph: No excessive bleeding, bruising or enlarged lymph nodes.   Physical Exam  There were no vitals taken for this visit.  General Appearance: Well nourished and well groomed and in no apparent distress.  Eyes: PERRLA, EOMs, conjunctiva no swelling or erythema, normal fundi and vessels. Sinuses: No frontal/maxillary tenderness ENT/Mouth: EACs patent / TMs  nl. Nares clear without erythema, swelling, mucoid exudates. Oral hygiene is good. No erythema, swelling, or exudate. Tongue normal, non-obstructing. Tonsils not swollen or erythematous. Hearing normal.  Neck: Supple, thyroid not palpable. No bruits, nodes or JVD. Respiratory:  Respiratory effort normal.  BS equal and clear bilateral without rales, rhonci, wheezing or stridor. Cardio: Heart sounds are normal with regular rate and rhythm and no murmurs, rubs or gallops. Peripheral pulses are normal and equal bilaterally without edema. No aortic or femoral bruits. Chest: symmetric with normal excursions and percussion.  Abdomen: Soft, with Nl bowel sounds. Nontender, no guarding, rebound, hernias, masses, or organomegaly.  Lymphatics: Non tender without lymphadenopathy.  Musculoskeletal: Full ROM all peripheral extremities, joint stability, 5/5 strength, and normal gait. Skin: Warm and dry without rashes, lesions, cyanosis, clubbing or  ecchymosis.  Neuro: Cranial nerves intact, reflexes equal bilaterally. Normal muscle tone, no cerebellar symptoms. Sensation intact.  Pysch: Alert and oriented X 3 with normal affect, insight and judgment appropriate.   Assessment and Plan  1. Labile hypertension  - EKG 12-Lead - Urinalysis, Routine w reflex microscopic - Microalbumin / creatinine urine ratio - CBC with Differential/Platelet - COMPLETE METABOLIC PANEL WITH GFR - Magnesium - TSH  2.  Hyperlipidemia, mixed  - EKG 12-Lead - Lipid panel - TSH  3. Abnormal glucose  - EKG 12-Lead - Hemoglobin A1c - Insulin, random  4. Vitamin D deficiency  - VITAMIN D 25 Hydroxy  5. Prediabetes  - EKG 12-Lead - Hemoglobin A1c - Insulin, random  6. BPH with obstruction/lower urinary tract symptoms  - PSA  7. Statin myopathy   8. Screening for colorectal cancer  - Cologuard  9. Prostate cancer screening  - PSA  10. Screening for ischemic heart disease  - EKG 12-Lead  11. FHx: heart disease  - EKG 12-Lead  12. Former smoker  - EKG 12-Lead  13. Screening for AAA (aortic abdominal aneurysm)   14. Medication management  - Urinalysis, Routine w reflex microscopic - Microalbumin / creatinine urine ratio - CBC with Differential/Platelet - COMPLETE  METABOLIC PANEL WITH GFR - Magnesium - Lipid panel - TSH - Hemoglobin A1c - Insulin, random - VITAMIN D 25 Hydroxy          Patient was counseled in prudent diet, weight control to achieve/maintain BMI less than 25, BP monitoring, regular exercise and medications as discussed.  Discussed med effects and SE's. Routine screening labs and tests as requested with regular follow-up as recommended. Over 40 minutes of exam, counseling, chart review and high complex critical decision making was performed   Kirtland Bouchard, MD

## 2022-04-22 NOTE — Patient Instructions (Signed)

## 2022-04-23 ENCOUNTER — Ambulatory Visit (INDEPENDENT_AMBULATORY_CARE_PROVIDER_SITE_OTHER): Payer: Medicare Other | Admitting: Internal Medicine

## 2022-04-23 ENCOUNTER — Encounter: Payer: Self-pay | Admitting: Internal Medicine

## 2022-04-23 VITALS — BP 138/76 | HR 85 | Temp 97.2°F | Ht 71.75 in | Wt 183.4 lb

## 2022-04-23 DIAGNOSIS — N401 Enlarged prostate with lower urinary tract symptoms: Secondary | ICD-10-CM | POA: Diagnosis not present

## 2022-04-23 DIAGNOSIS — E782 Mixed hyperlipidemia: Secondary | ICD-10-CM | POA: Diagnosis not present

## 2022-04-23 DIAGNOSIS — G720 Drug-induced myopathy: Secondary | ICD-10-CM

## 2022-04-23 DIAGNOSIS — Z1211 Encounter for screening for malignant neoplasm of colon: Secondary | ICD-10-CM

## 2022-04-23 DIAGNOSIS — Z8249 Family history of ischemic heart disease and other diseases of the circulatory system: Secondary | ICD-10-CM

## 2022-04-23 DIAGNOSIS — Z136 Encounter for screening for cardiovascular disorders: Secondary | ICD-10-CM | POA: Diagnosis not present

## 2022-04-23 DIAGNOSIS — Z79899 Other long term (current) drug therapy: Secondary | ICD-10-CM

## 2022-04-23 DIAGNOSIS — Z125 Encounter for screening for malignant neoplasm of prostate: Secondary | ICD-10-CM

## 2022-04-23 DIAGNOSIS — N138 Other obstructive and reflux uropathy: Secondary | ICD-10-CM | POA: Diagnosis not present

## 2022-04-23 DIAGNOSIS — R0989 Other specified symptoms and signs involving the circulatory and respiratory systems: Secondary | ICD-10-CM | POA: Diagnosis not present

## 2022-04-23 DIAGNOSIS — E559 Vitamin D deficiency, unspecified: Secondary | ICD-10-CM | POA: Diagnosis not present

## 2022-04-23 DIAGNOSIS — Z87891 Personal history of nicotine dependence: Secondary | ICD-10-CM

## 2022-04-23 DIAGNOSIS — T466X5A Adverse effect of antihyperlipidemic and antiarteriosclerotic drugs, initial encounter: Secondary | ICD-10-CM | POA: Diagnosis not present

## 2022-04-23 DIAGNOSIS — R7309 Other abnormal glucose: Secondary | ICD-10-CM | POA: Diagnosis not present

## 2022-04-23 DIAGNOSIS — Z1212 Encounter for screening for malignant neoplasm of rectum: Secondary | ICD-10-CM

## 2022-04-23 MED ORDER — EZETIMIBE 10 MG PO TABS: ORAL_TABLET | ORAL | 3 refills | Status: DC

## 2022-04-24 LAB — COMPLETE METABOLIC PANEL WITH GFR
AG Ratio: 1.7 (calc) (ref 1.0–2.5)
ALT: 11 U/L (ref 9–46)
AST: 12 U/L (ref 10–35)
Albumin: 4.5 g/dL (ref 3.6–5.1)
Alkaline phosphatase (APISO): 55 U/L (ref 35–144)
BUN: 13 mg/dL (ref 7–25)
CO2: 29 mmol/L (ref 20–32)
Calcium: 9.1 mg/dL (ref 8.6–10.3)
Chloride: 103 mmol/L (ref 98–110)
Creat: 0.83 mg/dL (ref 0.70–1.35)
Globulin: 2.6 g/dL (calc) (ref 1.9–3.7)
Glucose, Bld: 101 mg/dL — ABNORMAL HIGH (ref 65–99)
Potassium: 4.3 mmol/L (ref 3.5–5.3)
Sodium: 140 mmol/L (ref 135–146)
Total Bilirubin: 0.8 mg/dL (ref 0.2–1.2)
Total Protein: 7.1 g/dL (ref 6.1–8.1)
eGFR: 95 mL/min/{1.73_m2} (ref >=60)

## 2022-04-24 LAB — CBC WITH DIFFERENTIAL/PLATELET
Absolute Monocytes: 422 cells/uL (ref 200–950)
Basophils Absolute: 51 cells/uL (ref 0–200)
Basophils Relative: 0.8 %
Eosinophils Absolute: 179 cells/uL (ref 15–500)
Eosinophils Relative: 2.8 %
HCT: 47.9 % (ref 38.5–50.0)
Hemoglobin: 15.8 g/dL (ref 13.2–17.1)
Lymphs Abs: 1370 cells/uL (ref 850–3900)
MCH: 29 pg (ref 27.0–33.0)
MCHC: 33 g/dL (ref 32.0–36.0)
MCV: 88.1 fL (ref 80.0–100.0)
MPV: 11 fL (ref 7.5–12.5)
Monocytes Relative: 6.6 %
Neutro Abs: 4378 cells/uL (ref 1500–7800)
Neutrophils Relative %: 68.4 %
Platelets: 263 10*3/uL (ref 140–400)
RBC: 5.44 10*6/uL (ref 4.20–5.80)
RDW: 12.3 % (ref 11.0–15.0)
Total Lymphocyte: 21.4 %
WBC: 6.4 10*3/uL (ref 3.8–10.8)

## 2022-04-24 LAB — LIPID PANEL
Cholesterol: 180 mg/dL (ref <200)
HDL: 43 mg/dL (ref >=40)
LDL Cholesterol (Calc): 107 mg/dL (calc) — ABNORMAL HIGH
Non-HDL Cholesterol (Calc): 137 mg/dL (calc) — ABNORMAL HIGH (ref <130)
Total CHOL/HDL Ratio: 4.2 (calc) (ref <5.0)
Triglycerides: 186 mg/dL — ABNORMAL HIGH (ref <150)

## 2022-04-24 LAB — URINALYSIS, ROUTINE W REFLEX MICROSCOPIC
Bilirubin Urine: NEGATIVE
Glucose, UA: NEGATIVE
Hgb urine dipstick: NEGATIVE
Ketones, ur: NEGATIVE
Leukocytes,Ua: NEGATIVE
Nitrite: NEGATIVE
Protein, ur: NEGATIVE
Specific Gravity, Urine: 1.02 (ref 1.001–1.035)
pH: 7.5 (ref 5.0–8.0)

## 2022-04-24 LAB — MICROALBUMIN / CREATININE URINE RATIO
Creatinine, Urine: 180 mg/dL (ref 20–320)
Microalb Creat Ratio: 3 mcg/mg creat (ref <30)
Microalb, Ur: 0.6 mg/dL

## 2022-04-24 LAB — HEMOGLOBIN A1C
Hgb A1c MFr Bld: 6.2 % of total Hgb — ABNORMAL HIGH (ref <5.7)
Mean Plasma Glucose: 131 mg/dL
eAG (mmol/L): 7.3 mmol/L

## 2022-04-24 LAB — VITAMIN D 25 HYDROXY (VIT D DEFICIENCY, FRACTURES): Vit D, 25-Hydroxy: 51 ng/mL (ref 30–100)

## 2022-04-24 LAB — PSA: PSA: 4.55 ng/mL — ABNORMAL HIGH (ref <=4.00)

## 2022-04-24 LAB — INSULIN, RANDOM: Insulin: 5.4 u[IU]/mL

## 2022-04-24 LAB — MAGNESIUM: Magnesium: 2 mg/dL (ref 1.5–2.5)

## 2022-04-24 LAB — TSH: TSH: 2.48 mIU/L (ref 0.40–4.50)

## 2022-04-24 NOTE — Progress Notes (Signed)
<><><><><><><><><><><><><><><><><><><><><><><><><><><><><><><><><> <><><><><><><><><><><><><><><><><><><><><><><><><><><><><><><><><> - Test results slightly outside the reference range are not unusual. If there is anything important, I will review this with you,  otherwise it is considered normal test values.  If you have further questions,  please do not hesitate to contact me at the office or via My Chart.  <><><><><><><><><><><><><><><><><><><><><><><><><><><><><><><><><> <><><><><><><><><><><><><><><><><><><><><><><><><><><><><><><><><>  -  PSA is up a little  -  so will need to Re- check at July Office Visit <><><><><><><><><><><><><><><><><><><><><><><><><><><><><><><><><>  -  Total Chol = 180  is Excellent, but   - Bad LDL Chol = 107 is too high ,  Please read Dr Karlton Lemon Book "How not to Die", So   you can learn how to eat a healthier diet & I                                               improve your Cholesterol without more meds ! <><><><><><><><><><><><><><><><><><><><><><><><><><><><><><><><><>  -  A1c = 6.2%  - Blood sugar and A1c is elevated HIGHER  in the borderline and  early or pre-diabetes range which has the same   300% increased risk for heart attack, stroke, cancer and   alzheimer- type vascular dementia as full blown diabetes.   But the good news is that diet, exercise with weight loss can                                                                                   cure the early diabetes at this point.  - Again - Please read  the section in Dr Karlton Lemon Book                                                      "How not to Die" about curing Diabetes without meds. Please read Dr Karlton Lemon Book "How not to Die",     - It is very important that you work harder with diet by                                     avoiding all foods that are white except chicken, fish & calliflower.  - Avoid white rice  (brown & wild rice is OK),   - Avoid white potatoes   (sweet potatoes in moderation is OK),   White bread or wheat bread or anything made out of   white flour like bagels, donuts, rolls, buns, biscuits, cakes,  - pastries, cookies, pizza crust, and pasta (made from  white flour & egg whites)   - vegetarian pasta or spinach or wheat pasta is OK.  - Multigrain breads like Arnold's, Pepperidge Farm or   multigrain sandwich thins or high fiber breads like   [place] bread or "Dave's Killer" breads that are  4 to 5 grams fiber per slice !  are best.  Diet, exercise and weight loss can reverse and cure  diabetes in the early stages.    - Diet, exercise and weight loss is very important in the   control and prevention of complications of diabetes which  affects every system in your body, ie.   -Brain - dementia/stroke,  - eyes - glaucoma/blindness,  - heart - heart attack/heart failure,  - kidneys - dialysis,  - stomach - gastric paralysis,  - intestines - malabsorption,  - nerves - severe painful neuritis,  - circulation - gangrene & loss of a leg(s)  - and finally  . . . . . . . . . . . . . . . . . .    - cancer and Alzheimers. <><><><><><><><><><><><><><><><><><><><><><><><><><><><><><><><><>  -  Vit D = 51 - is a little Low  !   - - Vitamin D goal is between 70-100.   - Please INCREASE your Vitamin D -                      If taking only 2,000 - then increase up to 5,000 units                      If taking 5,000, then increase up to 10,000 units day   - It is very important as a natural anti-inflammatory and helping the  immune system protect against viral infections, like the Covid-19    helping hair, skin, and nails, as well as reducing stroke and  heart attack risk.   - It helps your bones and helps with mood.  - It also decreases numerous cancer risks so please  take it as directed.   - Low Vit D is associated with a 200-300% higher risk for  CANCER   and 200-300% higher risk for HEART   ATTACK  &   STROKE.    - It is also associated with higher death rate at younger ages,   autoimmune diseases like Rheumatoid arthritis, Lupus,  Multiple Sclerosis.     - Also many other serious conditions, like depression, Alzheimer's  Dementia, infertility, muscle aches, fatigue, fibromyalgia  <><><><><><><><><><><><><><><><><><><><><><><><><><><><><><><><><> <><><><><><><><><><><><><><><><><><><><><><><><><><><><><><><><><>  -  All Else - CBC - Kidneys - Electrolytes - Liver - Magnesium & Thyroid    - all  Normal / OK <><><><><><><><><><><><><><><><><><><><><><><><><><><><><><><><><> <><><><><><><><><><><><><><><><><><><><><><><><><><><><><><><><><>

## 2022-08-15 DIAGNOSIS — D2371 Other benign neoplasm of skin of right lower limb, including hip: Secondary | ICD-10-CM | POA: Diagnosis not present

## 2022-08-15 DIAGNOSIS — L821 Other seborrheic keratosis: Secondary | ICD-10-CM | POA: Diagnosis not present

## 2022-08-15 DIAGNOSIS — L570 Actinic keratosis: Secondary | ICD-10-CM | POA: Diagnosis not present

## 2022-08-15 DIAGNOSIS — D224 Melanocytic nevi of scalp and neck: Secondary | ICD-10-CM | POA: Diagnosis not present

## 2022-11-05 ENCOUNTER — Ambulatory Visit: Payer: Medicare Other | Admitting: Nurse Practitioner

## 2022-12-29 ENCOUNTER — Ambulatory Visit (INDEPENDENT_AMBULATORY_CARE_PROVIDER_SITE_OTHER): Payer: Medicare Other | Admitting: Nurse Practitioner

## 2022-12-29 ENCOUNTER — Encounter: Payer: Self-pay | Admitting: Nurse Practitioner

## 2022-12-29 VITALS — BP 138/80 | HR 53 | Temp 97.5°F | Ht 71.75 in | Wt 187.0 lb

## 2022-12-29 DIAGNOSIS — R6889 Other general symptoms and signs: Secondary | ICD-10-CM | POA: Diagnosis not present

## 2022-12-29 DIAGNOSIS — R350 Frequency of micturition: Secondary | ICD-10-CM | POA: Diagnosis not present

## 2022-12-29 DIAGNOSIS — Z79899 Other long term (current) drug therapy: Secondary | ICD-10-CM

## 2022-12-29 DIAGNOSIS — E559 Vitamin D deficiency, unspecified: Secondary | ICD-10-CM

## 2022-12-29 DIAGNOSIS — Z0000 Encounter for general adult medical examination without abnormal findings: Secondary | ICD-10-CM

## 2022-12-29 DIAGNOSIS — R972 Elevated prostate specific antigen [PSA]: Secondary | ICD-10-CM

## 2022-12-29 DIAGNOSIS — R0989 Other specified symptoms and signs involving the circulatory and respiratory systems: Secondary | ICD-10-CM

## 2022-12-29 DIAGNOSIS — R7309 Other abnormal glucose: Secondary | ICD-10-CM

## 2022-12-29 DIAGNOSIS — Z86010 Personal history of colonic polyps: Secondary | ICD-10-CM | POA: Diagnosis not present

## 2022-12-29 DIAGNOSIS — Z0001 Encounter for general adult medical examination with abnormal findings: Secondary | ICD-10-CM

## 2022-12-29 DIAGNOSIS — N401 Enlarged prostate with lower urinary tract symptoms: Secondary | ICD-10-CM | POA: Diagnosis not present

## 2022-12-29 DIAGNOSIS — E782 Mixed hyperlipidemia: Secondary | ICD-10-CM

## 2022-12-29 DIAGNOSIS — Z125 Encounter for screening for malignant neoplasm of prostate: Secondary | ICD-10-CM | POA: Diagnosis not present

## 2022-12-29 DIAGNOSIS — Z6824 Body mass index (BMI) 24.0-24.9, adult: Secondary | ICD-10-CM

## 2022-12-29 NOTE — Patient Instructions (Signed)
Prostate Cancer Screening  Prostate cancer screening is testing that is done to check for the presence of prostate cancer in men. The prostate gland is a walnut-sized gland that is located below the bladder and in front of the rectum in males. The function of the prostate is to add fluid to semen during ejaculation. Prostate cancer is one of the most common types of cancer in men. Who should have prostate cancer screening? Screening recommendations vary based on age and other risk factors, as well as between the professional organizations who make the recommendations. In general, screening is recommended if: You are age 70 to 16 and have an average risk for prostate cancer. You should talk with your health care provider about your need for screening and how often screening should be done. Because most prostate cancers are slow growing and will not cause death, screening in this age group is generally reserved for men who have a 10- to 15-year life expectancy. You are younger than age 700, and you have these risk factors: Having a father, brother, or uncle who has been diagnosed with prostate cancer. The risk is higher if your family member's cancer occurred at an early age or if you have multiple family members with prostate cancer at an early age. Being a male who is [place] or is of [place] or sub-Saharan African descent. In general, screening is not recommended if: You are younger than age 70. You are between the ages of 27 and 70 and you have no risk factors. You are 15 years of age or older. At this age, the risks that screening can cause are greater than the benefits that it may provide. If you are at high risk for prostate cancer, your health care provider may recommend that you have screenings more often or that you start screening at a younger age. How is screening for prostate cancer done? The recommended prostate cancer screening test is a blood test called the prostate-specific antigen  (PSA) test. PSA is a protein that is made in the prostate. As you age, your prostate naturally produces more PSA. Abnormally high PSA levels may be caused by: Prostate cancer. An enlarged prostate that is not caused by cancer (benign prostatic hyperplasia, or BPH). This condition is very common in older men. A prostate gland infection (prostatitis) or urinary tract infection. Certain medicines such as male hormones (like testosterone) or other medicines that raise testosterone levels. A rectal exam may be done as part of prostate cancer screening to help provide information about the size of your prostate gland. When a rectal exam is performed, it should be done after the PSA level is drawn to avoid any effect on the results. Depending on the PSA results, you may need more tests, such as: A physical exam to check the size of your prostate gland, if not done as part of screening. Blood and imaging tests. A procedure to remove tissue samples from your prostate gland for testing (biopsy). This is the only way to know for certain if you have prostate cancer. What are the benefits of prostate cancer screening? Screening can help to identify cancer at an early stage, before symptoms start and when the cancer can be treated more easily. There is a small chance that screening may lower your risk of dying from prostate cancer. The chance is small because prostate cancer is a slow-growing cancer, and most men with prostate cancer die from a different cause. What are the risks of prostate cancer screening? The  main risk of prostate cancer screening is diagnosing and treating prostate cancer that would never have caused any symptoms or problems. This is called overdiagnosisand overtreatment. PSA screening cannot tell you if your PSA is high due to cancer or a different cause. A prostate biopsy is the only procedure to diagnose prostate cancer. Even the results of a biopsy may not tell you if your cancer needs to  be treated. Slow-growing prostate cancer may not need any treatment other than monitoring, so diagnosing and treating it may cause unnecessary stress or other side effects. Questions to ask your health care provider When should I start prostate cancer screening? What is my risk for prostate cancer? How often do I need screening? What type of screening tests do I need? How do I get my test results? What do my results mean? Do I need treatment? Where to find more information The American Cancer Society: www.cancer.org American Urological Association: www.auanet.org Contact a health care provider if: You have difficulty urinating. You have pain when you urinate or ejaculate. You have blood in your urine or semen. You have pain in your back or in the area of your prostate. Summary Prostate cancer is a common type of cancer in men. The prostate gland is located below the bladder and in front of the rectum. This gland adds fluid to semen during ejaculation. Prostate cancer screening may identify cancer at an early stage, when the cancer can be treated more easily and is less likely to have spread to other areas of the body. The prostate-specific antigen (PSA) test is the recommended screening test for prostate cancer, but it has associated risks. Discuss the risks and benefits of prostate cancer screening with your health care provider. If you are age 64 or older, the risks that screening can cause are greater than the benefits that it may provide. This information is not intended to replace advice given to you by your health care provider. Make sure you discuss any questions you have with your health care provider. Document Revised: 09/24/2020 Document Reviewed: 09/24/2020 Elsevier Patient Education  2024 Elsevier Inc.   Healthy Eating, Adult Healthy eating may help you get and keep a healthy body weight, reduce the risk of chronic disease, and live a long and productive life. It is important  to follow a healthy eating pattern. Your nutritional and calorie needs should be met mainly by different nutrient-rich foods. What are tips for following this plan? Reading food labels Read labels and choose the following: Reduced or low sodium products. Juices with 100% fruit juice. Foods with low saturated fats (<3 g per serving) and high polyunsaturated and monounsaturated fats. Foods with whole grains, such as whole wheat, cracked wheat, brown rice, and wild rice. Whole grains that are fortified with folic acid. This is recommended for females who are pregnant or who want to become pregnant. Read labels and do not eat or drink the following: Foods or drinks with added sugars. These include foods that contain brown sugar, corn sweetener, corn syrup, dextrose, fructose, glucose, high-fructose corn syrup, honey, invert sugar, lactose, malt syrup, maltose, molasses, raw sugar, sucrose, trehalose, or turbinado sugar. Limit your intake of added sugars to less than 10% of your total daily calories. Do not eat more than the following amounts of added sugar per day: 6 teaspoons (25 g) for females. 9 teaspoons (38 g) for males. Foods that contain processed or refined starches and grains. Refined grain products, such as white flour, degermed cornmeal, white bread, and  white rice. Shopping Choose nutrient-rich snacks, such as vegetables, whole fruits, and nuts. Avoid high-calorie and high-sugar snacks, such as potato chips, fruit snacks, and candy. Use oil-based dressings and spreads on foods instead of solid fats such as butter, margarine, sour cream, or cream cheese. Limit pre-made sauces, mixes, and "instant" products such as flavored rice, instant noodles, and ready-made pasta. Try more plant-protein sources, such as tofu, tempeh, black beans, edamame, lentils, nuts, and seeds. Explore eating plans such as the Mediterranean diet or vegetarian diet. Try heart-healthy dips made with beans and healthy  fats like hummus and guacamole. Vegetables go great with these. Cooking Use oil to saut or stir-fry foods instead of solid fats such as butter, margarine, or lard. Try baking, boiling, grilling, or broiling instead of frying. Remove the fatty part of meats before cooking. Steam vegetables in water or broth. Meal planning  At meals, imagine dividing your plate into fourths: One-half of your plate is fruits and vegetables. One-fourth of your plate is whole grains. One-fourth of your plate is protein, especially lean meats, poultry, eggs, tofu, beans, or nuts. Include low-fat dairy as part of your daily diet. Lifestyle Choose healthy options in all settings, including home, work, school, restaurants, or stores. Prepare your food safely: Wash your hands after handling raw meats. Where you prepare food, keep surfaces clean by regularly washing with hot, soapy water. Keep raw meats separate from ready-to-eat foods, such as fruits and vegetables. Cook seafood, meat, poultry, and eggs to the recommended temperature. Get a food thermometer. Store foods at safe temperatures. In general: Keep cold foods at 89F (4.4C) or below. Keep hot foods at 189F (60C) or above. Keep your freezer at [facility] (-17.8C) or below. Foods are not safe to eat if they have been between the temperatures of 40-189F (4.4-60C) for more than 2 hours. What foods should I eat? Fruits Aim to eat 1-2 cups of fresh, canned (in natural juice), or frozen fruits each day. One cup of fruit equals 1 small apple, 1 large banana, 8 large strawberries, 1 cup (237 g) canned fruit,  cup (82 g) dried fruit, or 1 cup (240 mL) 100% juice. Vegetables Aim to eat 2-4 cups of fresh and frozen vegetables each day, including different varieties and colors. One cup of vegetables equals 1 cup (91 g) broccoli or cauliflower florets, 2 medium carrots, 2 cups (150 g) raw, leafy greens, 1 large tomato, 1 large bell pepper, 1 large sweet potato, or  1 medium white potato. Grains Aim to eat 5-10 ounce-equivalents of whole grains each day. Examples of 1 ounce-equivalent of grains include 1 slice of bread, 1 cup (40 g) ready-to-eat cereal, 3 cups (24 g) popcorn, or  cup (93 g) cooked rice. Meats and other proteins Try to eat 5-7 ounce-equivalents of protein each day. Examples of 1 ounce-equivalent of protein include 1 egg,  oz nuts (12 almonds, 24 pistachios, or 7 walnut halves), 1/4 cup (90 g) cooked beans, 6 tablespoons (90 g) hummus or 1 tablespoon (16 g) peanut butter. A cut of meat or fish that is the size of a deck of cards is about 3-4 ounce-equivalents (85 g). Of the protein you eat each week, try to have at least 8 sounce (227 g) of seafood. This is about 2 servings per week. This includes salmon, trout, herring, sardines, and anchovies. Dairy Aim to eat 3 cup-equivalents of fat-free or low-fat dairy each day. Examples of 1 cup-equivalent of dairy include 1 cup (240 mL) milk, 8 ounces (  250 g) yogurt, 1 ounces (44 g) natural cheese, or 1 cup (240 mL) fortified soy milk. Fats and oils Aim for about 5 teaspoons (21 g) of fats and oils per day. Choose monounsaturated fats, such as canola and olive oils, mayonnaise made with olive oil or avocado oil, avocados, peanut butter, and most nuts, or polyunsaturated fats, such as sunflower, corn, and soybean oils, walnuts, pine nuts, sesame seeds, sunflower seeds, and flaxseed. Beverages Aim for 6 eight-ounce glasses of water per day. Limit coffee to 3-5 eight-ounce cups per day. Limit caffeinated beverages that have added calories, such as soda and energy drinks. If you drink alcohol: Limit how much you have to: 0-1 drink a day if you are male. 0-2 drinks a day if you are male. Know how much alcohol is in your drink. In the [place] one drink is one 12 oz bottle of beer (355 mL), one 5 oz glass of wine (148 mL), or one 1 oz glass of hard liquor (44 mL). Seasoning and other foods Try not to add  too much salt to your food. Try using herbs and spices instead of salt. Try not to add sugar to food. This information is based on [place] nutrition guidelines. To learn more, visit DisposableNylon.be. Exact amounts may vary. You may need different amounts. This information is not intended to replace advice given to you by your health care provider. Make sure you discuss any questions you have with your health care provider. Document Revised: 12/30/2021 Document Reviewed: 12/30/2021 Elsevier Patient Education  2024 [employer].

## 2022-12-29 NOTE — Progress Notes (Signed)
MEDICARE ANNUAL WELLNESS VISIT AND FOLLOW UP  Assessment:   Poet was seen today for follow-up.  Diagnoses and all orders for this visit:  Annual Medicare Wellness Visit Due annually  Health maintenance reviewed Healthily lifestyle goals set  Labile hypertension Controlled Discussed DASH (Dietary Approaches to Stop Hypertension) DASH diet is lower in sodium than a typical American diet. Cut back on foods that are high in saturated fat, cholesterol, and trans fats. Eat more whole-grain foods, fish, poultry, and nuts Remain active and exercise as tolerated daily.  Monitor BP at home-Call if greater than 130/80.  Check CMP/CBC  Hyperlipidemia, mixed Discussed lifestyle modifications. Recommended diet heavy in fruits and veggies, omega 3's. Decrease consumption of animal meats, cheeses, and dairy products. Remain active and exercise as tolerated. Continue to monitor. Check lipids/TSH  Vitamin D deficiency Continue supplement for goal of 60-100 Monitor Vitamin D levels  Abnormal glucose (prediabetes) Education: Reviewed 'ABCs' of diabetes management  Discussed goals to be met and/or maintained include A1C (<7) Blood pressure (<130/80) Cholesterol (LDL <70) Continue Eye Exam yearly  Continue Dental Exam Q6 mo Discussed dietary recommendations Discussed Physical Activity recommendations Check A1C  History of adenomatous polyp of colon Next colonoscopy due 05/2024 per Dr. Adela Lank Encourage high fiber diet, low red/processed meat  High fiber handout given   Medication management All medications discussed and reviewed in full. All questions and concerns regarding medications addressed.    BMI 24 -normal Discussed appropriate BMI Diet modification. Physical activity. Encouraged/praised to build confidence.  Elevated PSA Monitor PSA levels Discussed if continues to be elevated referral to Urology for consultation.  Orders Placed This Encounter  Procedures    CBC with Differential/Platelet   COMPLETE METABOLIC PANEL WITH GFR   Lipid panel   Hemoglobin A1c   PSA    Notify office for further evaluation and treatment, questions or concerns if any reported s/s fail to improve.   The patient was advised to call back or seek an in-person evaluation if any symptoms worsen or if the condition fails to improve as anticipated.   Further disposition pending results of labs. Discussed med's effects and SE's.    I discussed the assessment and treatment plan with the patient. The patient was provided an opportunity to ask questions and all were answered. The patient agreed with the plan and demonstrated an understanding of the instructions.  Discussed med's effects and SE's. Screening labs and tests as requested with regular follow-up as recommended.  I provided 40 minutes of face-to-face time during this encounter including counseling, chart review, and critical decision making was preformed.  Today's Plan of Care is based on a patient-centered health care approach known as shared decision making - the decisions, tests and treatments allow for patient preferences and values to be balanced with clinical evidence.     Future Appointments  Date Time Provider Department Center  05/04/2023 10:00 AM Lucky Cowboy, MD [facility] None  12/29/2023 11:30 AM Adela Glimpse, NP [facility] None     Plan:   During the course of the visit the patient was educated and counseled about appropriate screening and preventive services including:   Pneumococcal vaccine  Influenza vaccine Prevnar 13 Td vaccine Screening electrocardiogram Colorectal cancer screening Diabetes screening Glaucoma screening Nutrition counseling    Subjective:  Parker Humphrey. is a 72 y.o. male who presents for Medicare Annual Wellness Visit and 3 month follow up. He has Essential hypertension; Hyperlipidemia; Vitamin D deficiency; Prediabetes; Medication management; and History  of adenomatous  polyp of colon on their problem list.  He reports overall he is doing well.  He does not have any health or medication concerns today.   He had + cologuard 03/2021, was referred to colonoscopy which was positive for several adenomatous polyps by Dr. Adela Lank on 05/29/2021, was recommended 3 year recall.          BMI is Body mass index is 25.54 kg/m., he has been working on diet and exercise. Wt Readings from Last 3 Encounters:  12/29/22 187 lb (84.8 kg)  04/23/22 183 lb 6.4 oz (83.2 kg)  09/18/21 184 lb (83.5 kg)   His blood pressure has been controlled at home, today their BP is BP: 138/80 He does workout He denies chest pain, shortness of breath, dizziness.   He is on cholesterol medication (zeita 10 mg) and denies myalgias. His cholesterol is not at goal. The cholesterol last visit was:   Lab Results  Component Value Date   CHOL 180 04/23/2022   HDL 43 04/23/2022   LDLCALC 107 (H) 04/23/2022   TRIG 186 (H) 04/23/2022   CHOLHDL 4.2 04/23/2022   He has been working on diet and exercise for prediabetes, and denies increased appetite, nausea, paresthesia of the feet, polydipsia, polyuria, visual disturbances and vomiting. Last A1C in the office was:  Lab Results  Component Value Date   HGBA1C 6.2 (H) 04/23/2022   Last GFR Lab Results  Component Value Date   EGFR 95 04/23/2022   Patient is on Vitamin D supplement.   Lab Results  Component Value Date   VD25OH 51 04/23/2022      Medication Review:   Current Outpatient Medications (Cardiovascular):    ezetimibe (ZETIA) 10 MG tablet, TAKE 1 TABLET EVERY DAY FOR CHOLESTEROL   Current Outpatient Medications (Analgesics):    aspirin 81 MG tablet, Take 81 mg by mouth daily.   Current Outpatient Medications (Other):    Ascorbic Acid (VITAMIN C PO), Take by mouth.   Cholecalciferol (VITAMIN D PO), Take 2,000 Units by mouth daily.    Cinnamon 500 MG TABS, Take by mouth 2 (two) times daily.   TURMERIC PO,  Take 1,000 mg by mouth 2 (two) times daily.   Zinc 50 MG TABS, Take by mouth.  Allergies: Allergies  Allergen Reactions   Pravastatin Rash   Red Yeast Rice [Cholestin] Rash    Current Problems (verified) has Essential hypertension; Hyperlipidemia; Vitamin D deficiency; Prediabetes; Medication management; and History of adenomatous polyp of colon on their problem list.  Screening Tests Immunization History  Administered Date(s) Administered   DTaP 04/14/2009   Influenza Split 01/12/2013   PPD Test 09/14/2013, 09/19/2014, 10/02/2015, 11/04/2017   Pneumococcal Conjugate-13 05/12/2018   Pneumococcal Polysaccharide-23 04/14/1997   Pneumococcal-Unspecified 01/13/2019   Td 04/14/1997, 01/11/2020   Tdap 07/31/2009   Health Maintenance  Topic Date Due   COVID-19 Vaccine (1) Never done   Zoster Vaccines- Shingrix (1 of 2) Never done   INFLUENZA VACCINE  11/13/2022   Medicare Annual Wellness (AWV)  12/29/2023   Colonoscopy  05/29/2024   DTaP/Tdap/Td (5 - Td or Tdap) 01/10/2030   Hepatitis C Screening  Completed   HPV VACCINES  Aged Out   Pneumonia Vaccine 26+ Years old  Discontinued   Fecal DNA (Cologuard)  Discontinued   Last colonoscopy: 05/2021, Dr. Adela Lank, adenomatous polyps, 3 year recall  Last cologuard: + 03/2021   Influenza: Declines  Shingles/Zostavax: declines  Names of Other Physician/Practitioners you currently use: 1. [facility] here for primary care 2. [facility], Dr Martha Clan, last visit 2023,  3. Dentist, Dr. Sherrie Mustache, last visit, 2024, goes q65m  Patient Care Team: Lucky Cowboy, MD as PCP - General (Internal Medicine)  Surgical: He  has a past surgical history that includes Knee arthroscopy (Left, 04/14/2008); Vasectomy (Bilateral, 04/14/1985); Inguinal hernia repair (Bilateral, 04/15/2007); Inguinal hernia repair (Left, 04/15/1995); Inguinal hernia repair (Left, 04/14/1996); Colonoscopy (N/A, 04/14/2004); and Total knee  arthroplasty (Left, 05/2019). Family His family history includes Cancer in his father; Diabetes in his brother; Heart disease in his father; Hyperlipidemia in his brother. Social history  He reports that he quit smoking about 31 years ago. His smoking use included cigarettes. He started smoking about 41 years ago. He has never used smokeless tobacco. He reports that he does not drink alcohol and does not use drugs.  MEDICARE WELLNESS OBJECTIVES: Physical activity: Current Exercise Habits: Home exercise routine Cardiac risk factors:   Depression/mood screen:      12/29/2022    1:32 PM  Depression screen PHQ 2/9  Decreased Interest 0  Down, Depressed, Hopeless 0  PHQ - 2 Score 0    ADLs:     12/29/2022    1:32 PM 04/22/2022    8:37 PM  In your present state of health, do you have any difficulty performing the following activities:  Hearing? 0 0  Vision? 0 0  Difficulty concentrating or making decisions? 0 0  Walking or climbing stairs? 0 0  Dressing or bathing? 0 0  Doing errands, shopping? 0 0     Cognitive Testing  Alert? Yes  Normal Appearance?Yes  Oriented to person? Yes  Place? Yes   Time? Yes  Recall of three objects?  Yes  Can perform simple calculations? Yes  Displays appropriate judgment?Yes  Can read the correct time from a watch face?Yes  EOL planning:     Objective:   Today's Vitals   12/29/22 1128  BP: 138/80  Pulse: (!) 53  Temp: (!) 97.5 F (36.4 C)  SpO2: 97%  Weight: 187 lb (84.8 kg)  Height: 5' 11.75" (1.822 m)     Body mass index is 25.54 kg/m.  General appearance: alert, no distress, WD/WN, male HEENT: normocephalic, sclerae anicteric, TMs pearly, nares patent, no discharge or erythema, pharynx normal Oral cavity: MMM, no lesions Neck: supple, no lymphadenopathy, no thyromegaly, no masses Heart: RRR, normal S1, S2, no murmurs Lungs: CTA bilaterally, no wheezes, rhonchi, or rales Abdomen: +bs, soft, non tender, non distended, no masses,  no hepatomegaly, no splenomegaly Musculoskeletal: nontender, no swelling, no obvious deformity Extremities: no edema, no cyanosis, no clubbing Pulses: 2+ symmetric, upper and lower extremities, normal cap refill Neurological: alert, oriented x 3, CN2-12 intact, strength normal upper extremities and lower extremities, sensation normal throughout, DTRs 2+ throughout, no cerebellar signs, gait normal Psychiatric: normal affect, behavior normal, pleasant   Medicare Attestation I have personally reviewed: The patient's medical and social history Their use of alcohol, tobacco or illicit drugs Their current medications and supplements The patient's functional ability including ADLs,fall risks, home safety risks, cognitive, and hearing and visual impairment Diet and physical activities  Evidence for depression or mood disorders  The patient's weight, height, BMI, and visual acuity have been recorded in the chart.  I have made referrals, counseling, and provided education to the patient based on review of the above and I have provided the patient with a written personalized care plan for preventive services.     Adela Glimpse, NP  05/15/2020  

## 2022-12-30 LAB — CBC WITH DIFFERENTIAL/PLATELET
Absolute Monocytes: 510 {cells}/uL (ref 200–950)
Basophils Absolute: 61 {cells}/uL (ref 0–200)
Basophils Relative: 0.9 %
Eosinophils Absolute: 177 {cells}/uL (ref 15–500)
Eosinophils Relative: 2.6 %
HCT: 45.6 % (ref 38.5–50.0)
Hemoglobin: 15 g/dL (ref 13.2–17.1)
Lymphs Abs: 1238 {cells}/uL (ref 850–3900)
MCH: 28.9 pg (ref 27.0–33.0)
MCHC: 32.9 g/dL (ref 32.0–36.0)
MCV: 87.9 fL (ref 80.0–100.0)
MPV: 11.4 fL (ref 7.5–12.5)
Monocytes Relative: 7.5 %
Neutro Abs: 4814 {cells}/uL (ref 1500–7800)
Neutrophils Relative %: 70.8 %
Platelets: 233 10*3/uL (ref 140–400)
RBC: 5.19 10*6/uL (ref 4.20–5.80)
RDW: 12.5 % (ref 11.0–15.0)
Total Lymphocyte: 18.2 %
WBC: 6.8 10*3/uL (ref 3.8–10.8)

## 2022-12-30 LAB — LIPID PANEL
Cholesterol: 180 mg/dL (ref <200)
HDL: 46 mg/dL (ref >=40)
LDL Cholesterol (Calc): 105 mg/dL — ABNORMAL HIGH
Non-HDL Cholesterol (Calc): 134 mg/dL — ABNORMAL HIGH (ref <130)
Total CHOL/HDL Ratio: 3.9 (calc) (ref <5.0)
Triglycerides: 171 mg/dL — ABNORMAL HIGH (ref <150)

## 2022-12-30 LAB — COMPLETE METABOLIC PANEL WITH GFR
AG Ratio: 1.6 (calc) (ref 1.0–2.5)
ALT: 14 U/L (ref 9–46)
AST: 14 U/L (ref 10–35)
Albumin: 4.4 g/dL (ref 3.6–5.1)
Alkaline phosphatase (APISO): 55 U/L (ref 35–144)
BUN: 14 mg/dL (ref 7–25)
CO2: 27 mmol/L (ref 20–32)
Calcium: 10 mg/dL (ref 8.6–10.3)
Chloride: 102 mmol/L (ref 98–110)
Creat: 0.81 mg/dL (ref 0.70–1.35)
Globulin: 2.7 g/dL (ref 1.9–3.7)
Glucose, Bld: 96 mg/dL (ref 65–99)
Potassium: 4.3 mmol/L (ref 3.5–5.3)
Sodium: 139 mmol/L (ref 135–146)
Total Bilirubin: 0.9 mg/dL (ref 0.2–1.2)
Total Protein: 7.1 g/dL (ref 6.1–8.1)
eGFR: 95 mL/min/{1.73_m2} (ref >=60)

## 2022-12-30 LAB — HEMOGLOBIN A1C
Hgb A1c MFr Bld: 6 %{Hb} — ABNORMAL HIGH (ref <5.7)
Mean Plasma Glucose: 126 mg/dL
eAG (mmol/L): 7 mmol/L

## 2022-12-30 LAB — PSA: PSA: 3.4 ng/mL (ref <=4.00)

## 2023-01-22 DIAGNOSIS — L820 Inflamed seborrheic keratosis: Secondary | ICD-10-CM | POA: Diagnosis not present

## 2023-04-19 ENCOUNTER — Other Ambulatory Visit: Payer: Self-pay | Admitting: Internal Medicine

## 2023-04-19 DIAGNOSIS — E782 Mixed hyperlipidemia: Secondary | ICD-10-CM

## 2023-04-27 ENCOUNTER — Encounter: Payer: Medicare Other | Admitting: Internal Medicine

## 2023-05-04 ENCOUNTER — Encounter: Payer: Medicare Other | Admitting: Internal Medicine

## 2023-06-03 ENCOUNTER — Telehealth: Payer: Self-pay | Admitting: *Deleted

## 2023-06-03 NOTE — Telephone Encounter (Signed)
 Lorene Dy, can you help with this.

## 2023-06-03 NOTE — Telephone Encounter (Signed)
 Copied from CRM [id number]. Topic: General - Other >> May 26, 2023  4:09 PM Almira Coaster wrote: Reason for CRM: Patient was a previous patient of Dr.McKeown who recently passed away. The office recommended Dr.Hunter as a new primary care. Advised patient that Dr.Hunter was not accepting new patients at the moment; however, he wanted to know if Dr.Hunter can make an exception. Best call back number [phone].

## 2023-06-29 ENCOUNTER — Encounter: Payer: Self-pay | Admitting: Family Medicine

## 2023-06-29 ENCOUNTER — Ambulatory Visit (INDEPENDENT_AMBULATORY_CARE_PROVIDER_SITE_OTHER): Payer: Medicare Other | Admitting: Family Medicine

## 2023-06-29 VITALS — BP 129/70 | HR 67 | Temp 97.2°F | Ht 71.0 in | Wt 190.6 lb

## 2023-06-29 DIAGNOSIS — R7303 Prediabetes: Secondary | ICD-10-CM

## 2023-06-29 DIAGNOSIS — E559 Vitamin D deficiency, unspecified: Secondary | ICD-10-CM | POA: Diagnosis not present

## 2023-06-29 DIAGNOSIS — E782 Mixed hyperlipidemia: Secondary | ICD-10-CM

## 2023-06-29 DIAGNOSIS — I10 Essential (primary) hypertension: Secondary | ICD-10-CM | POA: Diagnosis not present

## 2023-06-29 DIAGNOSIS — R972 Elevated prostate specific antigen [PSA]: Secondary | ICD-10-CM | POA: Diagnosis not present

## 2023-06-29 LAB — COMPREHENSIVE METABOLIC PANEL
ALT: 16 U/L (ref 0–53)
AST: 16 U/L (ref 0–37)
Albumin: 4.2 g/dL (ref 3.5–5.2)
Alkaline Phosphatase: 46 U/L (ref 39–117)
BUN: 16 mg/dL (ref 6–23)
CO2: 27 meq/L (ref 19–32)
Calcium: 9.1 mg/dL (ref 8.4–10.5)
Chloride: 106 meq/L (ref 96–112)
Creatinine, Ser: 0.84 mg/dL (ref 0.40–1.50)
GFR: 88.46 mL/min (ref >60.00)
Glucose, Bld: 92 mg/dL (ref 70–99)
Potassium: 4.2 meq/L (ref 3.5–5.1)
Sodium: 141 meq/L (ref 135–145)
Total Bilirubin: 0.8 mg/dL (ref 0.2–1.2)
Total Protein: 6.4 g/dL (ref 6.0–8.3)

## 2023-06-29 LAB — CBC WITH DIFFERENTIAL/PLATELET
Basophils Absolute: 0 10*3/uL (ref 0.0–0.1)
Basophils Relative: 0.8 % (ref 0.0–3.0)
Eosinophils Absolute: 0.2 10*3/uL (ref 0.0–0.7)
Eosinophils Relative: 3.8 % (ref 0.0–5.0)
HCT: 44.3 % (ref 39.0–52.0)
Hemoglobin: 14.7 g/dL (ref 13.0–17.0)
Lymphocytes Relative: 21.4 % (ref 12.0–46.0)
Lymphs Abs: 1.3 10*3/uL (ref 0.7–4.0)
MCHC: 33.2 g/dL (ref 30.0–36.0)
MCV: 87.8 fl (ref 78.0–100.0)
Monocytes Absolute: 0.5 10*3/uL (ref 0.1–1.0)
Monocytes Relative: 7.9 % (ref 3.0–12.0)
Neutro Abs: 4 10*3/uL (ref 1.4–7.7)
Neutrophils Relative %: 66.1 % (ref 43.0–77.0)
Platelets: 230 10*3/uL (ref 150.0–400.0)
RBC: 5.05 Mil/uL (ref 4.22–5.81)
RDW: 13.2 % (ref 11.5–15.5)
WBC: 6 10*3/uL (ref 4.0–10.5)

## 2023-06-29 LAB — LIPID PANEL
Cholesterol: 181 mg/dL (ref 0–200)
HDL: 41.6 mg/dL (ref >39.00)
LDL Cholesterol: 104 mg/dL — ABNORMAL HIGH (ref 0–99)
NonHDL: 139.04
Total CHOL/HDL Ratio: 4
Triglycerides: 176 mg/dL — ABNORMAL HIGH (ref 0.0–149.0)
VLDL: 35.2 mg/dL (ref 0.0–40.0)

## 2023-06-29 LAB — HEMOGLOBIN A1C: Hgb A1c MFr Bld: 6.3 % (ref 4.6–6.5)

## 2023-06-29 LAB — VITAMIN D 25 HYDROXY (VIT D DEFICIENCY, FRACTURES): VITD: 28.33 ng/mL — ABNORMAL LOW (ref 30.00–100.00)

## 2023-06-29 LAB — PSA: PSA: 4.3 ng/mL — ABNORMAL HIGH (ref 0.10–4.00)

## 2023-06-29 LAB — TSH: TSH: 3.26 u[IU]/mL (ref 0.35–5.50)

## 2023-06-29 NOTE — Assessment & Plan Note (Signed)
At goal today without medications. 

## 2023-06-29 NOTE — Assessment & Plan Note (Signed)
 Check A1c.  Discussed lifestyle modifications.

## 2023-06-29 NOTE — Patient Instructions (Signed)
 It was very nice to see you today!  We will check blood work today.  Please continue to work on diet and exercise.  I will see back in year for your next physical.  Come back sooner if needed.  Return in about 1 year (around 06/28/2024) for Annual Physical.   Take care, Dr Jimmey Ralph  PLEASE NOTE:  If you had any lab tests, please let us know if you have not heard back within a few days. You may see your results on mychart before we have a chance to review them but we will give you a call once they are reviewed by [place].   If we ordered any referrals today, please let us know if you have not heard from their office within the next week.   If you had any urgent prescriptions sent in today, please check with the pharmacy within an hour of our visit to make sure the prescription was transmitted appropriately.   Please try these tips to maintain a healthy lifestyle:  Eat at least 3 REAL meals and 1-2 snacks per day.  Aim for no more than 5 hours between eating.  If you eat breakfast, please do so within one hour of getting up.   Each meal should contain half fruits/vegetables, one quarter protein, and one quarter carbs (no bigger than a computer mouse)  Cut down on sweet beverages. This includes juice, soda, and sweet tea.   Drink at least 1 glass of water with each meal and aim for at least 8 glasses per day  Exercise at least 150 minutes every week.    Preventive Care 62 Years and Older, Male Preventive care refers to lifestyle choices and visits with your health care provider that can promote health and wellness. Preventive care visits are also called wellness exams. What can I expect for my preventive care visit? Counseling During your preventive care visit, your health care provider may ask about your: Medical history, including: Past medical problems. Family medical history. History of falls. Current health, including: Emotional well-being. Home life and relationship  well-being. Sexual activity. Memory and ability to understand (cognition). Lifestyle, including: Alcohol, nicotine or tobacco, and drug use. Access to firearms. Diet, exercise, and sleep habits. Work and work Astronomer. Sunscreen use. Safety issues such as seatbelt and bike helmet use. Physical exam Your health care provider will check your: Height and weight. These may be used to calculate your BMI (body mass index). BMI is a measurement that tells if you are at a healthy weight. Waist circumference. This measures the distance around your waistline. This measurement also tells if you are at a healthy weight and may help predict your risk of certain diseases, such as type 2 diabetes and high blood pressure. Heart rate and blood pressure. Body temperature. Skin for abnormal spots. What immunizations do I need?  Vaccines are usually given at various ages, according to a schedule. Your health care provider will recommend vaccines for you based on your age, medical history, and lifestyle or other factors, such as travel or where you work. What tests do I need? Screening Your health care provider may recommend screening tests for certain conditions. This may include: Lipid and cholesterol levels. Diabetes screening. This is done by checking your blood sugar (glucose) after you have not eaten for a while (fasting). Hepatitis C test. Hepatitis B test. HIV (human immunodeficiency virus) test. STI (sexually transmitted infection) testing, if you are at risk. Lung cancer screening. Colorectal cancer screening. Prostate cancer screening.  Abdominal aortic aneurysm (AAA) screening. You may need this if you are a current or former smoker. Talk with your health care provider about your test results, treatment options, and if necessary, the need for more tests. Follow these instructions at home: Eating and drinking  Eat a diet that includes fresh fruits and vegetables, whole grains, lean  protein, and low-fat dairy products. Limit your intake of foods with high amounts of sugar, saturated fats, and salt. Take vitamin and mineral supplements as recommended by your health care provider. Do not drink alcohol if your health care provider tells you not to drink. If you drink alcohol: Limit how much you have to 0-2 drinks a day. Know how much alcohol is in your drink. In the [place] one drink equals one 12 oz bottle of beer (355 mL), one 5 oz glass of wine (148 mL), or one 1 oz glass of hard liquor (44 mL). Lifestyle Brush your teeth every morning and night with fluoride toothpaste. Floss one time each day. Exercise for at least 30 minutes 5 or more days each week. Do not use any products that contain nicotine or tobacco. These products include cigarettes, chewing tobacco, and vaping devices, such as e-cigarettes. If you need help quitting, ask your health care provider. Do not use drugs. If you are sexually active, practice safe sex. Use a condom or other form of protection to prevent STIs. Take aspirin only as told by your health care provider. Make sure that you understand how much to take and what form to take. Work with your health care provider to find out whether it is safe and beneficial for you to take aspirin daily. Ask your health care provider if you need to take a cholesterol-lowering medicine (statin). Find healthy ways to manage stress, such as: Meditation, yoga, or listening to music. Journaling. Talking to a trusted person. Spending time with friends and family. Safety Always wear your seat belt while driving or riding in a vehicle. Do not drive: If you have been drinking alcohol. Do not ride with someone who has been drinking. When you are tired or distracted. While texting. If you have been using any mind-altering substances or drugs. Wear a helmet and other protective equipment during sports activities. If you have firearms in your house, make sure you follow  all gun safety procedures. Minimize exposure to UV radiation to reduce your risk of skin cancer. What's next? Visit your health care provider once a year for an annual wellness visit. Ask your health care provider how often you should have your eyes and teeth checked. Stay up to date on all vaccines. This information is not intended to replace advice given to you by your health care provider. Make sure you discuss any questions you have with your health care provider. Document Revised: 09/26/2020 Document Reviewed: 09/26/2020 Elsevier Patient Education  2024 [employer].

## 2023-06-29 NOTE — Assessment & Plan Note (Signed)
 Check lipids.  Discussed lifestyle modifications.  He is on Zetia 10 mg daily.  Tolerating well.

## 2023-06-29 NOTE — Assessment & Plan Note (Signed)
 Check vitamin D.

## 2023-06-29 NOTE — Progress Notes (Signed)
 Parker Nies. is a 73 y.o. male who presents today for an office visit.  He is a new patient.   Assessment/Plan:  Chronic Problems Addressed Today: Essential hypertension At goal today without medications.   Hyperlipidemia Check lipids.  Discussed lifestyle modifications.  He is on Zetia 10 mg daily.  Tolerating well.  Vitamin D deficiency Check vitamin D.  Preventative health care Due for colonoscopy next year.  Declined shingles vaccine.    Subjective:  HPI:  See Assessment / plan for status of chronic conditions. Patient here today as a new patient. His medical history significant for dyslipidemia treated with Zetia 10 mg daily.  He also is taking a daily baby aspirin.  He has history of prediabetes that is diet controlled.  He has no acute concerns today.  ROS: Per HPI, otherwise a complete review of systems was negative.   PMH:  The following were reviewed and entered/updated in epic: Past Medical History:  Diagnosis Date   Diabetes mellitus without complication (HCC) 07/14/2010   A1c 6.1% preDiabetes   H/O vasectomy 04/14/1985   Hyperlipidemia 04/15/2003   Hypertension 04/15/2007   labile-monitoring expectantly   Hypogonadism male 01/12/2013   Testosterone = 255 (Patient declined treatment)   Post-operative nausea and vomiting    Vitamin D deficiency 04/14/2006   Vit D = 37   Patient Active Problem List   Diagnosis Date Noted   History of adenomatous polyp of colon 09/17/2021   Essential hypertension 09/14/2013   Hyperlipidemia 09/14/2013   Vitamin D deficiency 09/14/2013   Prediabetes 09/14/2013   Past Surgical History:  Procedure Laterality Date   COLONOSCOPY N/A 04/14/2004   INGUINAL HERNIA REPAIR Bilateral 04/15/2007   INGUINAL HERNIA REPAIR Left 04/15/1995   INGUINAL HERNIA REPAIR Left 04/14/1996   KNEE ARTHROSCOPY Left 04/14/2008   TOTAL KNEE ARTHROPLASTY 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     aspirin 81 MG tablet Take 81 mg by mouth daily.     Cholecalciferol (VITAMIN D PO) Take 2,000 Units by mouth daily.      Cinnamon 500 MG TABS Take by mouth 2 (two) times daily.     ezetimibe (ZETIA) 10 MG tablet TAKE 1 TABLET BY MOUTH EVERY DAY FOR CHOLESTEROL 90 tablet 3   TURMERIC PO Take 1,000 mg by mouth 2 (two) times daily.     Zinc 50 MG TABS Take by mouth.     No current facility-administered medications for this visit.    Allergies-reviewed and updated Allergies  Allergen Reactions   Pravastatin Rash   Red Yeast Rice [Cholestin] Rash    Social History   Socioeconomic History   Marital status: Married    Spouse name: Not on file   Number of children: Not on file   Years of education: Not on file   Highest education level: Bachelor's degree (e.g., BA, AB, BS)  Occupational History   Not on file  Tobacco Use   Smoking  status: Former    Current packs/day: 0.00    Types: Cigarettes    Start date: 09/14/1981    Quit date: 09/15/1991    Years since quitting: 31.8   Smokeless tobacco: Never  Vaping Use   Vaping status: Never Used  Substance and Sexual Activity   Alcohol use: No   Drug use: No   Sexual activity: Yes  Other Topics Concern   Not on file  Social History Narrative   Not on file   Social Drivers of Health   Financial Resource Strain: Not on file  Food Insecurity: No Food Insecurity (06/25/2023)   Hunger Vital Sign    Worried About Running Out of Food in the Last Year: Never true    Ran Out of Food in the Last Year: Never true  Transportation Needs: No Transportation Needs  (06/25/2023)   PRAPARE - Administrator, Civil Service (Medical): No    Lack of Transportation (Non-Medical): No  Physical Activity: Insufficiently Active (06/25/2023)   Exercise Vital Sign    Days of Exercise per Week: 3 days    Minutes of Exercise per Session: 30 min  Stress: No Stress Concern Present (06/25/2023)   [employer] of Occupational Health - Occupational Stress Questionnaire    Feeling of Stress : Not at all  Social Connections: Socially Integrated (06/25/2023)   Social Connection and Isolation Panel [NHANES]    Frequency of Communication with Friends and Family: Three times a week    Frequency of Social Gatherings with Friends and Family: Twice a week    Attends Religious Services: More than 4 times per year    Active Member of [employer] or Organizations: Yes    Attends Engineer, structural: More than 4 times per year    Marital Status: Married        Objective:  Physical Exam: BP 129/70   Pulse 67   Temp (!) 97.2 F (36.2 C) (Temporal)   Ht 5\' 11"  (1.803 m)   Wt 190 lb 9.6 oz (86.5 kg)   SpO2 98%   BMI 26.58 kg/m   Gen: No acute distress, resting comfortably CV: Regular rate and rhythm with no murmurs appreciated Pulm: Normal work of breathing, clear to auscultation bilaterally with no crackles, wheezes, or rhonchi Neuro: Grossly normal, moves all extremities Psych: Normal affect and thought content      Chemika Nightengale M. Jimmey Ralph, MD 06/29/2023 8:30 AM

## 2023-06-30 ENCOUNTER — Encounter: Payer: Self-pay | Admitting: Family Medicine

## 2023-06-30 ENCOUNTER — Ambulatory Visit: Payer: Medicare Other | Admitting: Family Medicine

## 2023-06-30 NOTE — Progress Notes (Signed)
 PSA is borderline elevated.  Recommend we recheck here again in 3 to 6 months or refer him to see urology.  His vitamin D is a little bit low.  Recommend starting 2000 to 5000 IUs daily.  We should recheck again in 3 to 6 months.  His blood sugar is in the borderline diabetic range.  Do not need to start meds for this.  This is similar to his past several values.  He should continue to work on diet and exercise and we can recheck in 6 to 12 months.  His cholesterol is also borderline elevated but stable compared to his last several values.  Do not need to make any changes to his treatment plan at this time.  He should continue to work on diet and exercise and we can recheck this again in a year or so.

## 2023-07-01 ENCOUNTER — Encounter: Payer: Medicare Other | Admitting: Internal Medicine

## 2023-07-02 DIAGNOSIS — H903 Sensorineural hearing loss, bilateral: Secondary | ICD-10-CM | POA: Diagnosis not present

## 2023-12-29 ENCOUNTER — Ambulatory Visit: Payer: Medicare Other | Admitting: Nurse Practitioner

## 2024-01-06 DIAGNOSIS — D2371 Other benign neoplasm of skin of right lower limb, including hip: Secondary | ICD-10-CM | POA: Diagnosis not present

## 2024-01-06 DIAGNOSIS — L570 Actinic keratosis: Secondary | ICD-10-CM | POA: Diagnosis not present

## 2024-01-06 DIAGNOSIS — D2271 Melanocytic nevi of right lower limb, including hip: Secondary | ICD-10-CM | POA: Diagnosis not present

## 2024-01-06 DIAGNOSIS — L821 Other seborrheic keratosis: Secondary | ICD-10-CM | POA: Diagnosis not present

## 2024-01-06 DIAGNOSIS — L820 Inflamed seborrheic keratosis: Secondary | ICD-10-CM | POA: Diagnosis not present

## 2024-01-06 DIAGNOSIS — D692 Other nonthrombocytopenic purpura: Secondary | ICD-10-CM | POA: Diagnosis not present

## 2024-01-13 ENCOUNTER — Ambulatory Visit

## 2024-04-22 ENCOUNTER — Other Ambulatory Visit: Payer: Self-pay | Admitting: Nurse Practitioner

## 2024-04-22 DIAGNOSIS — E782 Mixed hyperlipidemia: Secondary | ICD-10-CM

## 2024-06-29 ENCOUNTER — Encounter: Admitting: Family Medicine
